# Patient Record
Sex: Male | Born: 1965 | Race: White | Hispanic: No | Marital: Married | Smoking: Never smoker
Health system: Southern US, Community
[De-identification: ages and names within clinical notes are randomized; demographics above are authoritative.]

## PROBLEM LIST (undated history)

## (undated) DIAGNOSIS — T7840XA Allergy, unspecified, initial encounter: Secondary | ICD-10-CM

## (undated) DIAGNOSIS — K219 Gastro-esophageal reflux disease without esophagitis: Secondary | ICD-10-CM

## (undated) DIAGNOSIS — E785 Hyperlipidemia, unspecified: Secondary | ICD-10-CM

## (undated) DIAGNOSIS — M109 Gout, unspecified: Secondary | ICD-10-CM

## (undated) DIAGNOSIS — I10 Essential (primary) hypertension: Secondary | ICD-10-CM

## (undated) DIAGNOSIS — K649 Unspecified hemorrhoids: Secondary | ICD-10-CM

## (undated) DIAGNOSIS — J302 Other seasonal allergic rhinitis: Secondary | ICD-10-CM

## (undated) HISTORY — DX: Allergy, unspecified, initial encounter: T78.40XA

## (undated) HISTORY — DX: Essential (primary) hypertension: I10

## (undated) HISTORY — PX: FRACTURE SURGERY: SHX138

## (undated) HISTORY — PX: CLAVICLE SURGERY: SHX598

## (undated) HISTORY — DX: Gout, unspecified: M10.9

## (undated) HISTORY — PX: EXCISIONAL HEMORRHOIDECTOMY: SHX1541

## (undated) HISTORY — DX: Other seasonal allergic rhinitis: J30.2

## (undated) HISTORY — PX: WISDOM TOOTH EXTRACTION: SHX21

## (undated) HISTORY — DX: Unspecified hemorrhoids: K64.9

## (undated) HISTORY — DX: Hyperlipidemia, unspecified: E78.5

## (undated) HISTORY — DX: Gastro-esophageal reflux disease without esophagitis: K21.9

---

## 1985-11-23 HISTORY — PX: APPENDECTOMY: SHX54

## 1996-11-23 HISTORY — PX: HAND SURGERY: SHX662

## 1998-12-03 ENCOUNTER — Encounter: Payer: Self-pay | Admitting: Gastroenterology

## 1998-12-03 ENCOUNTER — Ambulatory Visit (HOSPITAL_COMMUNITY): Admission: RE | Admit: 1998-12-03 | Discharge: 1998-12-03 | Payer: Self-pay | Admitting: Gastroenterology

## 2004-11-23 HISTORY — PX: CLAVICLE SURGERY: SHX598

## 2006-10-19 ENCOUNTER — Ambulatory Visit: Payer: Self-pay | Admitting: Internal Medicine

## 2006-10-19 LAB — CONVERTED CEMR LAB
ALT: 34 units/L (ref 0–40)
AST: 29 units/L (ref 0–37)
Albumin: 4.7 g/dL (ref 3.5–5.2)
Alkaline Phosphatase: 79 units/L (ref 39–117)
Bilirubin, Direct: 0.2 mg/dL (ref 0.0–0.3)
Chol/HDL Ratio, serum: 6.2
Cholesterol: 223 mg/dL (ref 0–200)
HDL: 36.2 mg/dL — ABNORMAL LOW (ref >39.0)
LDL DIRECT: 171.4 mg/dL
Total Bilirubin: 1.2 mg/dL (ref 0.3–1.2)
Total Protein: 7.2 g/dL (ref 6.0–8.3)
Triglyceride fasting, serum: 141 mg/dL (ref 0–149)
VLDL: 28 mg/dL (ref 0–40)

## 2006-11-28 ENCOUNTER — Emergency Department (HOSPITAL_COMMUNITY): Admission: EM | Admit: 2006-11-28 | Discharge: 2006-11-28 | Payer: Self-pay | Admitting: Family Medicine

## 2007-02-11 ENCOUNTER — Emergency Department (HOSPITAL_COMMUNITY): Admission: EM | Admit: 2007-02-11 | Discharge: 2007-02-11 | Payer: Self-pay | Admitting: Family Medicine

## 2007-07-27 ENCOUNTER — Ambulatory Visit: Payer: Self-pay | Admitting: Internal Medicine

## 2007-07-27 DIAGNOSIS — E785 Hyperlipidemia, unspecified: Secondary | ICD-10-CM | POA: Insufficient documentation

## 2007-07-28 LAB — CONVERTED CEMR LAB
ALT: 44 units/L (ref 0–53)
AST: 29 units/L (ref 0–37)
Albumin: 4.4 g/dL (ref 3.5–5.2)
Alkaline Phosphatase: 81 units/L (ref 39–117)
Bilirubin, Direct: 0.2 mg/dL (ref 0.0–0.3)
Cholesterol: 263 mg/dL (ref 0–200)
Direct LDL: 123.2 mg/dL
HDL: 35 mg/dL — ABNORMAL LOW (ref >39.0)
Total Bilirubin: 1.2 mg/dL (ref 0.3–1.2)
Total CHOL/HDL Ratio: 7.5
Total Protein: 7.3 g/dL (ref 6.0–8.3)
Triglycerides: 526 mg/dL (ref 0–149)
VLDL: 105 mg/dL — ABNORMAL HIGH (ref 0–40)

## 2007-08-04 ENCOUNTER — Telehealth: Payer: Self-pay | Admitting: Internal Medicine

## 2007-08-25 ENCOUNTER — Telehealth (INDEPENDENT_AMBULATORY_CARE_PROVIDER_SITE_OTHER): Payer: Self-pay | Admitting: *Deleted

## 2008-05-10 ENCOUNTER — Emergency Department (HOSPITAL_COMMUNITY): Admission: EM | Admit: 2008-05-10 | Discharge: 2008-05-10 | Payer: Self-pay | Admitting: Family Medicine

## 2008-07-17 ENCOUNTER — Emergency Department (HOSPITAL_COMMUNITY): Admission: EM | Admit: 2008-07-17 | Discharge: 2008-07-17 | Payer: Self-pay | Admitting: Family Medicine

## 2009-01-25 ENCOUNTER — Ambulatory Visit: Payer: Self-pay | Admitting: Internal Medicine

## 2009-01-25 DIAGNOSIS — J309 Allergic rhinitis, unspecified: Secondary | ICD-10-CM

## 2009-03-12 ENCOUNTER — Telehealth: Payer: Self-pay | Admitting: Internal Medicine

## 2009-05-18 ENCOUNTER — Telehealth: Payer: Self-pay | Admitting: Family Medicine

## 2009-11-11 ENCOUNTER — Telehealth: Payer: Self-pay | Admitting: Internal Medicine

## 2009-11-13 ENCOUNTER — Telehealth: Payer: Self-pay | Admitting: Internal Medicine

## 2009-11-18 ENCOUNTER — Ambulatory Visit: Payer: Self-pay | Admitting: Internal Medicine

## 2009-11-18 LAB — CONVERTED CEMR LAB
Cholesterol: 201 mg/dL — ABNORMAL HIGH (ref 0–200)
Direct LDL: 109.4 mg/dL
HDL: 39.8 mg/dL (ref >39.00)
Total CHOL/HDL Ratio: 5
Triglycerides: 328 mg/dL — ABNORMAL HIGH (ref 0.0–149.0)
VLDL: 65.6 mg/dL — ABNORMAL HIGH (ref 0.0–40.0)

## 2009-11-28 ENCOUNTER — Ambulatory Visit: Payer: Self-pay | Admitting: Internal Medicine

## 2010-02-11 ENCOUNTER — Telehealth: Payer: Self-pay | Admitting: Internal Medicine

## 2010-02-11 ENCOUNTER — Encounter: Payer: Self-pay | Admitting: Internal Medicine

## 2010-02-13 ENCOUNTER — Ambulatory Visit: Payer: Self-pay | Admitting: Internal Medicine

## 2010-02-13 LAB — CONVERTED CEMR LAB
ALT: 41 units/L (ref 0–53)
AST: 26 units/L (ref 0–37)
Albumin: 4.4 g/dL (ref 3.5–5.2)
Alkaline Phosphatase: 70 units/L (ref 39–117)
BUN: 13 mg/dL (ref 6–23)
Basophils Absolute: 0 10*3/uL (ref 0.0–0.1)
Basophils Relative: 0.8 % (ref 0.0–3.0)
Bilirubin Urine: NEGATIVE
Bilirubin, Direct: 0.1 mg/dL (ref 0.0–0.3)
Blood in Urine, dipstick: NEGATIVE
CO2: 27 meq/L (ref 19–32)
Calcium: 9.3 mg/dL (ref 8.4–10.5)
Chloride: 104 meq/L (ref 96–112)
Cholesterol: 228 mg/dL — ABNORMAL HIGH (ref 0–200)
Creatinine, Ser: 0.8 mg/dL (ref 0.4–1.5)
Direct LDL: 131.1 mg/dL
Eosinophils Absolute: 0.2 10*3/uL (ref 0.0–0.7)
Eosinophils Relative: 3.7 % (ref 0.0–5.0)
GFR calc non Af Amer: 111.77 mL/min (ref >60)
Glucose, Bld: 94 mg/dL (ref 70–99)
Glucose, Urine, Semiquant: NEGATIVE
HCT: 45.5 % (ref 39.0–52.0)
HDL: 43.4 mg/dL (ref >39.00)
Hemoglobin: 15.4 g/dL (ref 13.0–17.0)
Ketones, urine, test strip: NEGATIVE
Lymphocytes Relative: 31.2 % (ref 12.0–46.0)
Lymphs Abs: 1.5 10*3/uL (ref 0.7–4.0)
MCHC: 34 g/dL (ref 30.0–36.0)
MCV: 93.2 fL (ref 78.0–100.0)
Monocytes Absolute: 0.4 10*3/uL (ref 0.1–1.0)
Monocytes Relative: 8.7 % (ref 3.0–12.0)
Neutro Abs: 2.7 10*3/uL (ref 1.4–7.7)
Neutrophils Relative %: 55.6 % (ref 43.0–77.0)
Nitrite: NEGATIVE
Platelets: 195 10*3/uL (ref 150.0–400.0)
Potassium: 3.7 meq/L (ref 3.5–5.1)
Protein, U semiquant: NEGATIVE
RBC: 4.88 M/uL (ref 4.22–5.81)
RDW: 11.5 % (ref 11.5–14.6)
Sodium: 141 meq/L (ref 135–145)
Specific Gravity, Urine: 1.02
TSH: 2.23 microintl units/mL (ref 0.35–5.50)
Total Bilirubin: 0.7 mg/dL (ref 0.3–1.2)
Total CHOL/HDL Ratio: 5
Total Protein: 7.4 g/dL (ref 6.0–8.3)
Triglycerides: 310 mg/dL — ABNORMAL HIGH (ref 0.0–149.0)
Urobilinogen, UA: 0.2
VLDL: 62 mg/dL — ABNORMAL HIGH (ref 0.0–40.0)
WBC Urine, dipstick: NEGATIVE
WBC: 4.8 10*3/uL (ref 4.5–10.5)

## 2010-02-18 ENCOUNTER — Ambulatory Visit: Payer: Self-pay | Admitting: Internal Medicine

## 2010-04-02 IMAGING — CT CT PARANASAL SINUSES LIMITED
1 of 2 series · 16 of 25 positions shown, 20 images · non-contrast
Comparison: None

CLINICAL DATA: Acute sinusitis.

CT PARANASAL SINUS LIMITED WITHOUT CONTRAST

[Series 4: ltd sinus 3.0 h30s · axial · 0.29mm/px · z∈[-99,-1]mm · 16 of 24 slices shown, 20 images]
[im 2/24  brain]
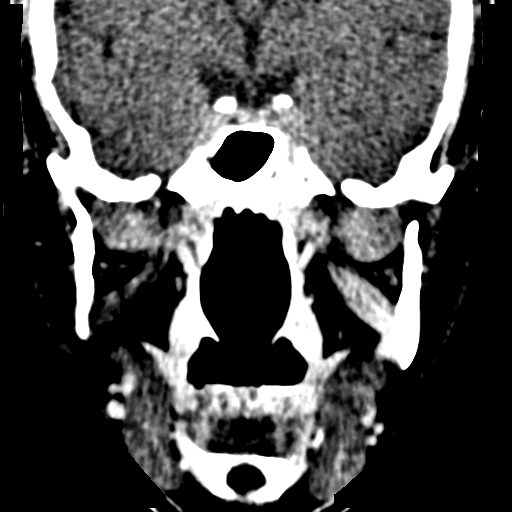
[im 2/24  bone]
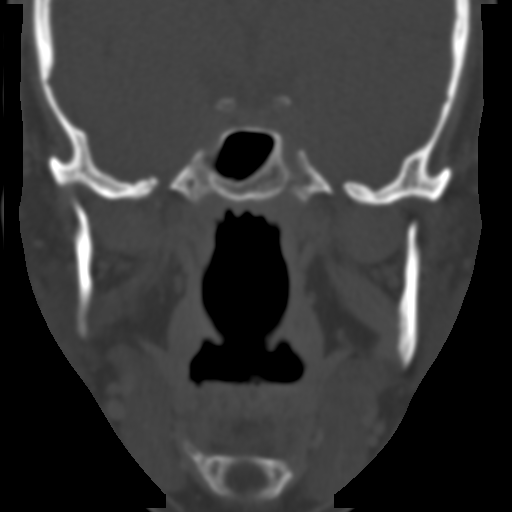
[im 4/24  bone]
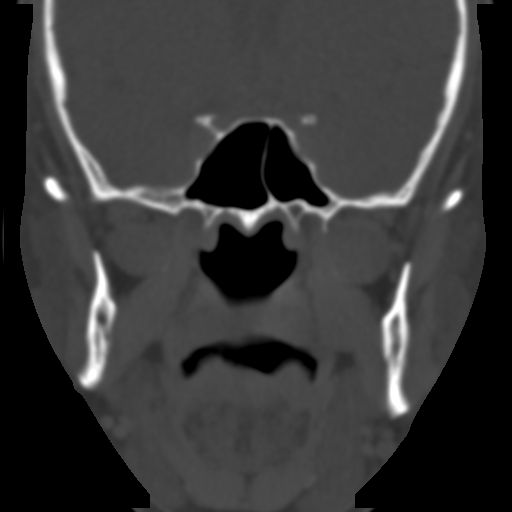
[im 5/24  bone]
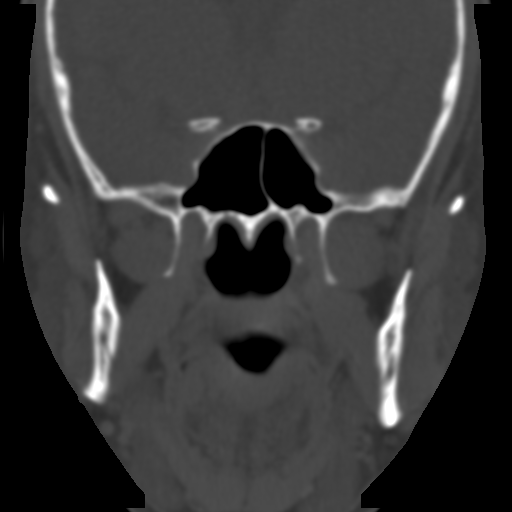
[im 6/24  bone]
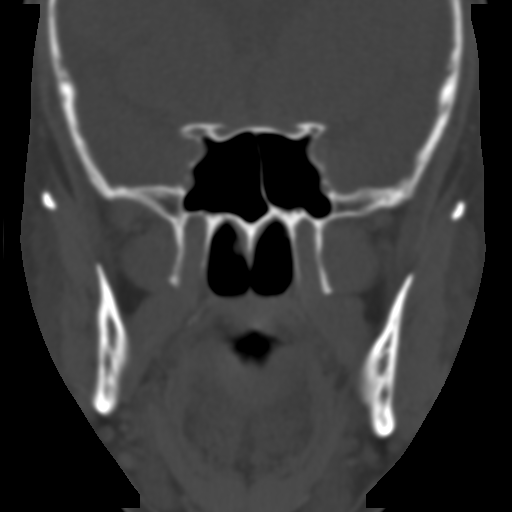
[im 8/24  brain]
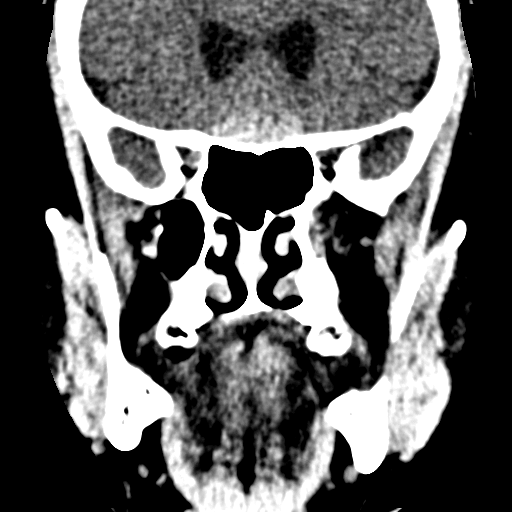
[im 8/24  bone]
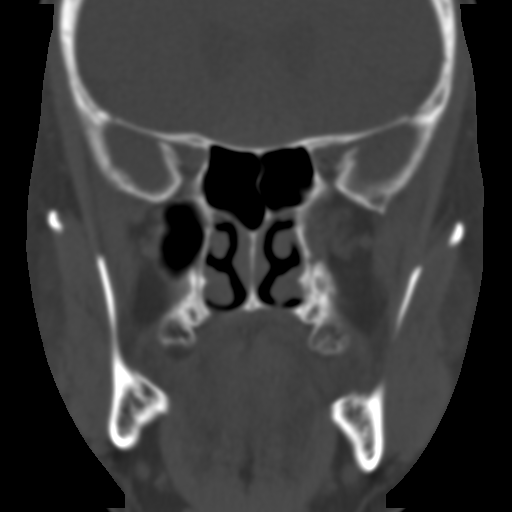
[im 9/24  bone]
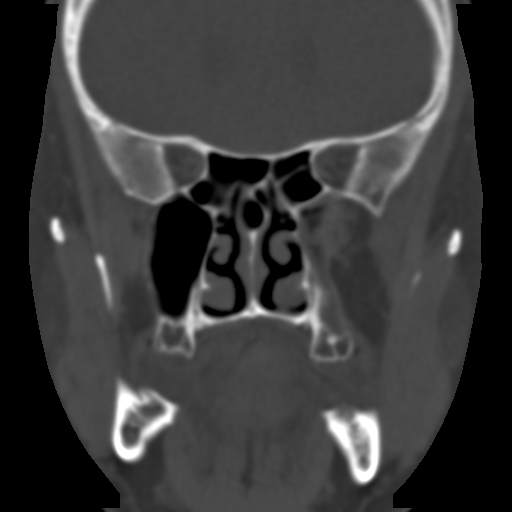
[im 10/24  bone]
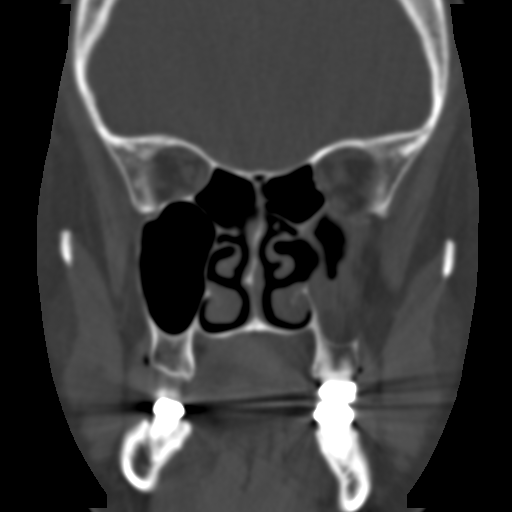
[im 12/24  bone]
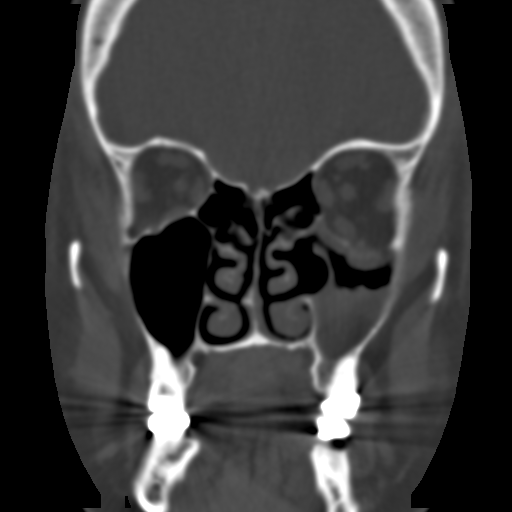
[im 13/24  brain]
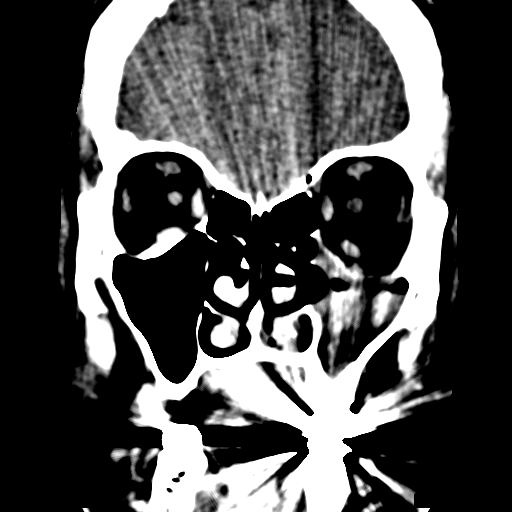
[im 13/24  bone]
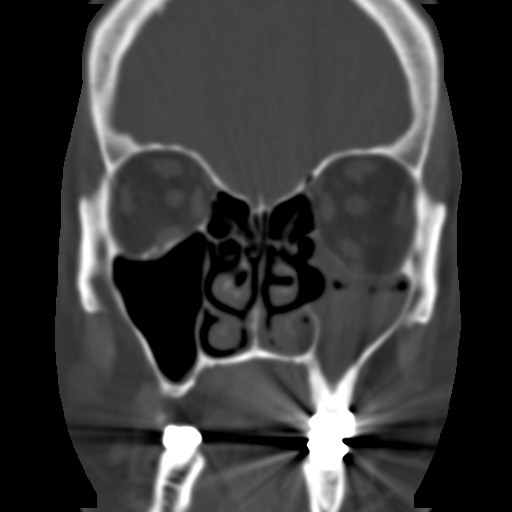
[im 15/24  bone]
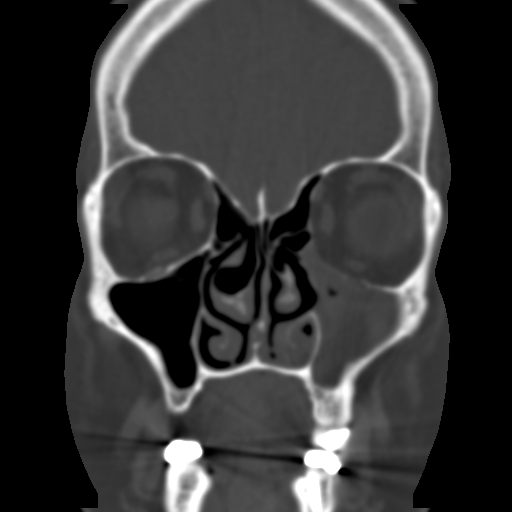
[im 16/24  bone]
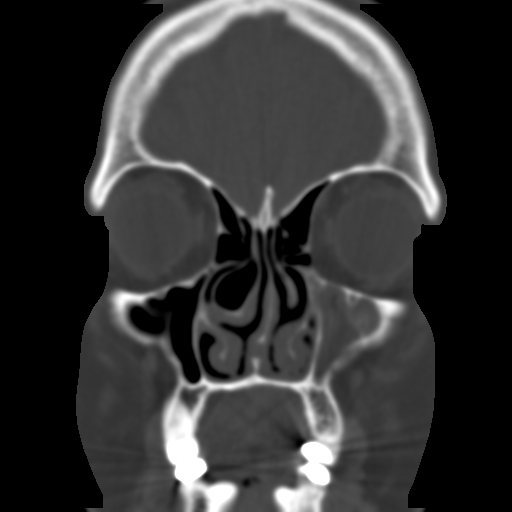
[im 17/24  bone]
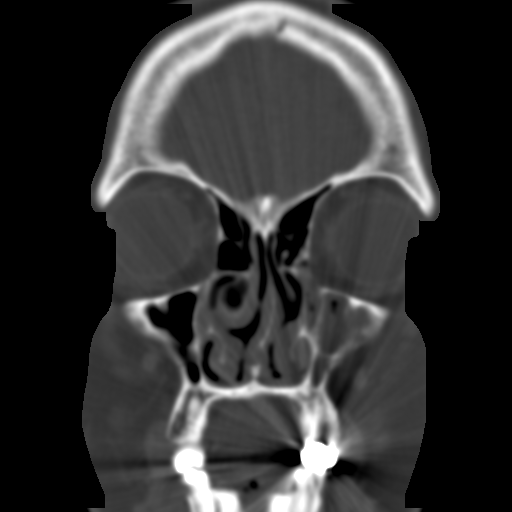
[im 19/24  brain]
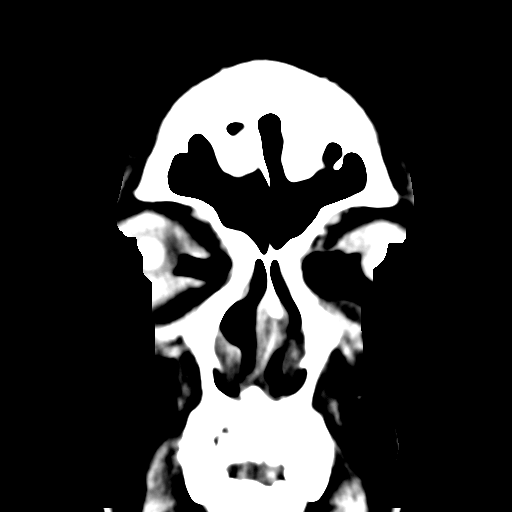
[im 19/24  bone]
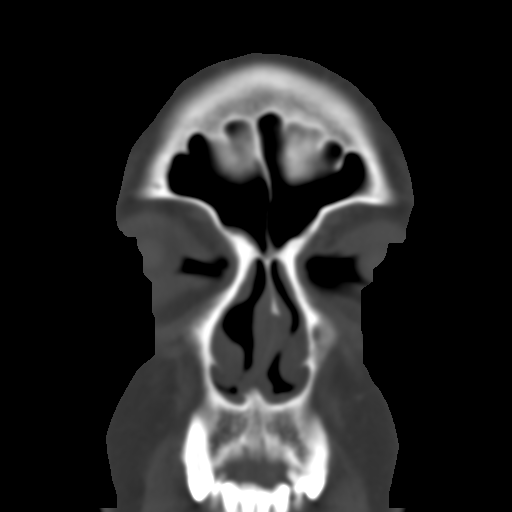
[im 20/24  bone]
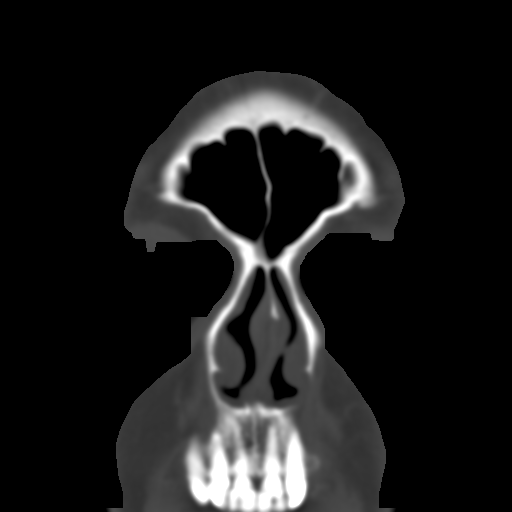
[im 21/24  bone]
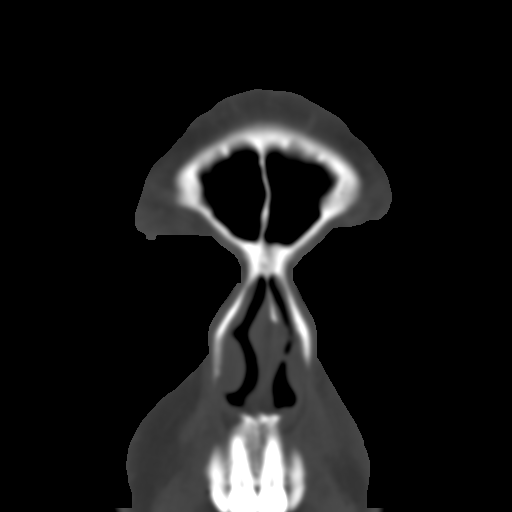
[im 23/24  bone]
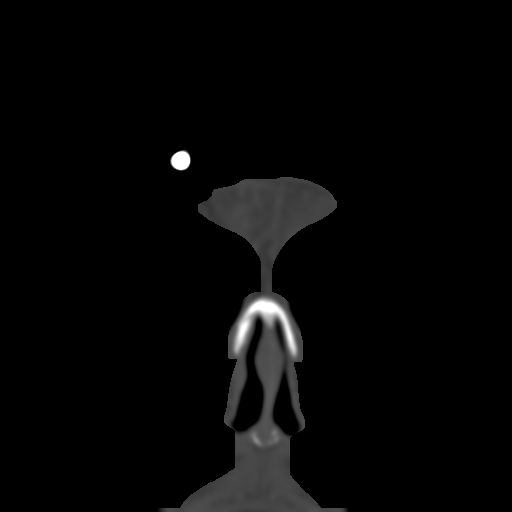

[16 of 25 positions shown; findings below may reference images not displayed]

FINDINGS: There is an air-fluid level in the left maxillary sinus
with frothy secretions.  This is compatible with acute sinusitis.
There is mild mucosal thickening in the base of the right maxillary
sinus.  There is mild mucosal thickening in the frontal sinuses.

The nasal septum is deviated to the left, partially due to a large
concha bullosa of the right middle turbinate.  No acute bony
abnormality.  There is no soft tissue mass.
IMPRESSION: Acute maxillary sinusitis on the left.

## 2010-09-25 ENCOUNTER — Telehealth: Payer: Self-pay | Admitting: Internal Medicine

## 2010-12-14 ENCOUNTER — Encounter: Payer: Self-pay | Admitting: Otolaryngology

## 2010-12-23 NOTE — Assessment & Plan Note (Signed)
Summary: new to lipid/hyperlipidemia   Primary Provider:  Birdie Sons MD   History of Present Illness:       This is a 45 year old male who presents for his first evaluation in [facility] Clinic.  The patient presents with history of hyperlipidemia, but has no history of smoking or obesity.  Pertinent family history includes hyperlipidemia/CAD - father died of MI had multiple - may have been 50ish when had first.  The patient is compliant with medication-fair.    Hypertension History:      Positive major cardiovascular risk factors include hyperlipidemia.  Negative major cardiovascular risk factors include male age less than 2 years old, no history of diabetes, and non-tobacco-user status.        Further assessment for target organ damage reveals no history of renal insufficiency.    Has tried simvastatin, atorvastatin, rouvastatin - all cause muscle pain/ cramping  - in hands moslty - after taking consistantly for a few months. - Pt non- compliant with meds and unsure of time frame with muscle pains   Preventive Screening-Counseling & Management  Alcohol-Tobacco     Alcohol drinks/day: 3-4 on weekends and maybe 1-2 other days /week     Alcohol type: beer     Smoking Status: never  Caffeine-Diet-Exercise     Caffeine use/day: 1     Does Patient Exercise: yes     Type of exercise: weights     Times/week: <3  Current Medications (verified): 1)  Sudafed 30 Mg  Tabs (Pseudoephedrine Hcl) .... Prn 2)  Prednisone 20 Mg Tabs (Prednisone) .... Take 3 By Mouth Once Daily For 2 Days Then 2 By Mouth Once Daily For 3 Days or As Needed 3)  Zithromax Z-Pak 250 Mg Tabs (Azithromycin) .... As Directed 4)  Flonase 50 Mcg/act Susp (Fluticasone Propionate) .... 2 Sprays Each Nostril Q D 5)  Lovaza 1 Gm Caps (Omega-3-Acid Ethyl Esters) .... 2 By Mouth Two Times A Day 6)  Simvastatin 40 Mg Tabs (Simvastatin) .... Take One Tablet By Mouth Daily At Bedtime  Allergies (verified): 1)  ! Pcn 2)  ! *  Peanuts  Social History: Alcohol drinks/day:  3-4 on weekends and maybe 1-2 other days /week Caffeine use/day:  1 Smoking Status:  never Does Patient Exercise:  yes   Vital Signs:  Patient profile:   45 year old male Height:      73 inches Weight:      192 pounds BMI:     25.42 Pulse rate:   71 / minute Pulse rhythm:   regular BP sitting:   130 / 90  (left arm) Cuff size:   large  Impression & Recommendations:  Problem # 1:  HYPERLIPIDEMIA (ICD-272.4)  His updated medication list for this problem includes:    Lovaza 1 Gm Caps (Omega-3-acid ethyl esters) .Marland Kitchen... 2 by mouth two times a day    Simvastatin 40 Mg Tabs (Simvastatin) .Marland Kitchen... Take one tablet by mouth daily at bedtime  This is Johnny Hopkins first visit to Lipid Clinic.  His wife is a Teacher, early years/pre at [employer].  He a history of increased lipids especially increased TG and positive family history for MI.  He has  no history of CAD or DM.  He is non-compliant with meds and heart healthy diet.  He will take his mds for a few weeks then quit as well as eat healthy for a couple weeks then increase junk food.    TC  201 > goal <  200    LDL 109 > goal < 100  HDL 40  at goal > 40   TG 161  > goal < 150  Plan  - add cardio exercise 2 days a week - walking  / also lifting weights 2-3 days a week - decrease soda, sugar snacks and CHO with snacks and meals - increase water- - be more contientious about med compliance  - add CoQ 10 to help prevent muscle cramps - f/u 3 months  Hypertension Assessment/Plan:      The patient's hypertensive risk group is category B: At least one risk factor (excluding diabetes) with no target organ damage.  Today's blood pressure is 130/90.    Prescriptions: SIMVASTATIN 40 MG TABS (SIMVASTATIN) Take one tablet by mouth daily at bedtime  #30 x 3   Entered by:   Leota Sauers, PharmD, BCPS, CPP   Authorized by:   Gaylord Shih, MD, [facility]   Signed by:   Leota Sauers, PharmD, BCPS, CPP on 11/28/2009   Method  used:   Electronically to        [facility]* (retail)       [address]       [address] Shipping/mailing       [place], [place]  [address]       Ph: [phone]       Fax: [phone]   RxID:   [id number] LOVAZA 1 GM CAPS (OMEGA-3-ACID ETHYL ESTERS) 2 by mouth two times a day  #360 x 3   Entered by:   Leota Sauers, PharmD, BCPS, CPP   Authorized by:   Gaylord Shih, MD, [facility]   Signed by:   Leota Sauers, PharmD, BCPS, CPP on 11/28/2009   Method used:   Electronically to        [facility]* (retail)       [address]       [address] Shipping/mailing       [place], [place]  [address]       Ph: [phone]       Fax: [phone]   RxID:   [id number]

## 2010-12-23 NOTE — Miscellaneous (Signed)
Summary: Orders Update  Clinical Lists Changes  Orders: Added new Test order of TLB-Lipid Panel (80061-LIPID) - Signed Added new Test order of TLB-Hepatic/Liver Function Pnl (80076-HEPATIC) - Signed 

## 2010-12-23 NOTE — Assessment & Plan Note (Signed)
Summary: CPX//CCM   Vital Signs:  Patient profile:   45 year old male Height:      72.5 inches Weight:      190 pounds BMI:     25.51 Pulse rate:   76 / minute Pulse rhythm:   regular Resp:     12 per minute BP sitting:   116 / 74  (left arm)  Vitals Entered By: Gladis Riffle, RN (February 18, 2010 10:14 AM) CC: cpx, labs done--requests metaxalone for prn use for back flare Is Patient Diabetic? No   Primary Care Provider:  Birdie Sons MD  CC:  cpx and labs done--requests metaxalone for prn use for back flare.  History of Present Illness: CPX  Preventive Screening-Counseling & Management  Alcohol-Tobacco     Alcohol drinks/day: 3-4 on weekends and maybe 1-2 other days /week     Alcohol type: beer     Smoking Status: never  Current Problems (verified): 1)  Preventive Health Care  (ICD-V70.0) 2)  Allergic Rhinitis  (ICD-477.9) 3)  Hyperlipidemia  (ICD-272.4) 4)  Prostatitis,  (ICD-601.0)  Current Medications (verified): 1)  Flonase 50 Mcg/act Susp (Fluticasone Propionate) .... 2 Sprays Each Nostril Q D 2)  Lovaza 1 Gm Caps (Omega-3-Acid Ethyl Esters) .... 2 By Mouth Two Times A Day 3)  Simvastatin 40 Mg Tabs (Simvastatin) .... Take One Tablet By Mouth Daily At Bedtime  Allergies: 1)  ! Pcn 2)  ! * Peanuts  Family History: Mother--alive and well Father deceased CHF/MI 48s  Social History: Married-monogomous pet poodle  hh of 4   Occupation: exhibit (trade show display)   Impression & Recommendations:  Problem # 1:  PREVENTIVE HEALTH CARE (ICD-V70.0) health maint UTD encouraged daily exercise  Problem # 2:  HYPERLIPIDEMIA (ICD-272.4)  he is convinced that simvastatin is causing some aches and pains---has tried other statins with similar sxs (lipitor , crestor) trial of holding meds then trial of livalo 1mg  every other day , continue lovaza for now see me 6 weeks after starting lo The following medications were removed from the medication list:  Simvastatin 40 Mg Tabs (Simvastatin) .Marland Kitchen... Take one tablet by mouth daily at bedtime His updated medication list for this problem includes:    Lovaza 1 Gm Caps (Omega-3-acid ethyl esters) .Marland Kitchen... 2 by mouth two times a day    Livalo 2 Mg Tabs (Pitavastatin calcium) .Marland Kitchen... 1/2 every other day start in one month (april 27/2011)  Labs Reviewed: SGOT: 26 (02/13/2010)   SGPT: 41 (02/13/2010)  Prior 10 Yr Risk Heart Disease: Not enough information (11/28/2009)   HDL:43.40 (02/13/2010), 39.80 (11/18/2009)  LDL:DEL (07/27/2007), DEL (10/19/2006)  Chol:228 (02/13/2010), 201 (11/18/2009)  Trig:310.0 (02/13/2010), 328.0 (11/18/2009)  Complete Medication List: 1)  Flonase 50 Mcg/act Susp (Fluticasone propionate) .... 2 sprays each nostril q d 2)  Lovaza 1 Gm Caps (Omega-3-acid ethyl esters) .... 2 by mouth two times a day 3)  Livalo 2 Mg Tabs (Pitavastatin calcium) .... 1/2 every other day start in one month (april 27/2011) 4)  Methocarbamol 750 Mg Tabs (Methocarbamol) .... Take 1 tablet by mouth two times a day as needed back pain  Other Orders: Tdap => 74yrs IM ([address]403) Admin 1st Vaccine (47425)  Patient Instructions: 1)  see me 3 months 2)  lipids 272.4 3)  liver 995.2  Prescriptions: METHOCARBAMOL 750 MG  TABS (METHOCARBAMOL) Take 1 tablet by mouth two times a day as needed back pain  #20 x 1   Entered and Authorized by:   Smitty Cords  Delsa Walder MD   Signed by:   Birdie Sons MD on 02/18/2010   Method used:   Electronically to        [facility]* (retail)       [address]       [address] Shipping/mailing       [place], [place]  [address]       Ph: [phone]       Fax: [phone]   RxID:   [id number]  Physical Exam General Appearance: well developed, well nourished, no acute distress Eyes: conjunctiva and lids normal, PERRLA, EOMI,  Ears, Nose, Mouth, Throat: TM clear, nares clear, oral exam WNL Neck: supple, no lymphadenopathy, no thyromegaly, no JVD Respiratory:  clear to auscultation and percussion, respiratory effort normal Cardiovascular: regular rate and rhythm, S1-S2, no murmur, rub or gallop, no bruits, peripheral pulses normal and symmetric, no cyanosis, clubbing, edema or varicosities Chest: no scars, masses, tenderness; no asymmetry, skin changes,, no gynecomastia   Gastrointestinal: soft, non-tender; no hepatosplenomegaly, masses; active bowel sounds all quadrants, guaiac negative stool; no masses, tenderness, hemorrhoids  Genitourinary: no hernia, or prostate enlargement Lymphatic: no cervical, axillary or inguinal adenopathy Musculoskeletal: gait normal, muscle tone and strength WNL, no joint swelling, effusions, discoloration, crepitus  Skin: clear, good turgor, color WNL, no rashes, lesions, or ulcerations Neurologic: normal mental status, normal reflexes, normal strength, sensation, and motion Psychiatric: alert; oriented to person, place and time Other Exam:      Immunizations Administered:  Tetanus Vaccine:    Vaccine Type: Tdap    Site: left deltoid    Mfr: GlaxoSmithKline    Dose: 0.5 ml    Route: IM    Given by: Gladis Riffle, RN    Exp. Date: 09/18/2010    Lot #: [id number]

## 2010-12-23 NOTE — Progress Notes (Signed)
Summary: REQ FOR ADDITIONAL LAB ORDER  Phone Note From Other Clinic   Caller: Jamie w/ [facility] Summary of Call: Asher Muir w/ [facility] called to adv that there may be an addition to the labs that are being ordered for pt to have at time of appt on Thursday, 02/13/2010 at [facility].... We will be awaiting order for addtional labs from Physician. Initial call taken by: Debbra Riding,  February 11, 2010 3:02 PM  Follow-up for Phone Call        Phone Note Completed-----Orders have been signed off by Dr Cato Mulligan... See additional notation in EMR for same.  Follow-up by: Debbra Riding,  February 11, 2010 3:04 PM

## 2010-12-23 NOTE — Progress Notes (Signed)
Summary: REFILL REQUEST  Phone Note Refill Request Message from:  Patient on September 25, 2010 12:10 PM  Refills Requested: Medication #1:  FLONASE 50 MCG/ACT SUSP 2 sprays each nostril q d   Notes: 90-DAY SUPPLY.... [facility].  Pt req that Rx be filled today - adv he is going out of town and would like to p/u Rx today if possible.    Initial call taken by: Debbra Riding,  September 25, 2010 12:10 PM  Follow-up for Phone Call        Rx called to pharmacy Follow-up by: Alfred Levins, CMA,  September 25, 2010 1:02 PM    Prescriptions: FLONASE 50 MCG/ACT SUSP (FLUTICASONE PROPIONATE) 2 sprays each nostril q d  #3 x 1   Entered by:   Alfred Levins, CMA   Authorized by:   Birdie Sons MD   Signed by:   Alfred Levins, CMA on 09/25/2010   Method used:   Electronically to        [facility]* (retail)       [address]       [address] Shipping/mailing       [place], [place]  [address]       Ph: [phone]       Fax: [phone]   RxID:   [id number]

## 2011-02-16 ENCOUNTER — Other Ambulatory Visit: Payer: Self-pay | Admitting: Cardiology

## 2011-02-19 NOTE — Telephone Encounter (Signed)
[address] °

## 2011-02-20 NOTE — Telephone Encounter (Signed)
Pt sees lipid clinic

## 2011-08-20 LAB — POCT RAPID STREP A: Streptococcus, Group A Screen (Direct): NEGATIVE

## 2011-10-26 ENCOUNTER — Other Ambulatory Visit: Payer: Self-pay | Admitting: Internal Medicine

## 2011-10-26 ENCOUNTER — Other Ambulatory Visit (HOSPITAL_COMMUNITY): Payer: Self-pay | Admitting: Sports Medicine

## 2011-10-26 DIAGNOSIS — M25562 Pain in left knee: Secondary | ICD-10-CM

## 2011-10-27 ENCOUNTER — Other Ambulatory Visit (HOSPITAL_COMMUNITY): Payer: Self-pay

## 2011-10-29 ENCOUNTER — Other Ambulatory Visit (HOSPITAL_COMMUNITY): Payer: Self-pay

## 2012-04-04 ENCOUNTER — Telehealth: Payer: Self-pay | Admitting: Internal Medicine

## 2012-04-04 DIAGNOSIS — E785 Hyperlipidemia, unspecified: Secondary | ICD-10-CM

## 2012-04-04 NOTE — Telephone Encounter (Signed)
Per Dr Cato Mulligan needs OV, 1 wk prior can have cpx labs and uric acid

## 2012-04-04 NOTE — Telephone Encounter (Signed)
Called and lft vm for pt to sch a cpx with labs 1 wk prior. Waiting on call back.

## 2012-04-04 NOTE — Telephone Encounter (Signed)
Pt can put pressure on it.  It has been bothering him for 2 weeks and puts ice on it.  Told pt to go to [facility] or go to [facility] so they could get an xray.  Pt does not want to do that tonight.  He states that feels better.  He does not remember injuring it.  Pt said if it got bad he would do whatever Dr Cato Mulligan wanted him to do but he doesn't feel like it needs to be xrayed cause he feels its not broke

## 2012-04-04 NOTE — Telephone Encounter (Signed)
Pt called back and has schd cpx for 05/03/12 and cpx labs w/ uric acid tested on 04/05/12 as noted. Pt said that his ankle is hurting really bad. At times it hurts so bad that he can't walk on it. Redness and swelling for 2 wks.

## 2012-04-04 NOTE — Telephone Encounter (Signed)
Pt called req to get labs ordered to check cholesterol lvls. Also pt is req to be tested for gout because pt says that he has been having problems with ankle. Pain, redness and swelling.

## 2012-04-05 ENCOUNTER — Other Ambulatory Visit (INDEPENDENT_AMBULATORY_CARE_PROVIDER_SITE_OTHER): Payer: 59

## 2012-04-05 DIAGNOSIS — Z0000 Encounter for general adult medical examination without abnormal findings: Secondary | ICD-10-CM

## 2012-04-05 LAB — HEPATIC FUNCTION PANEL
Bilirubin, Direct: 0 mg/dL (ref 0.0–0.3)
Total Bilirubin: 0.5 mg/dL (ref 0.3–1.2)

## 2012-04-05 LAB — LIPID PANEL
HDL: 40.2 mg/dL (ref >39.00)
Triglycerides: 235 mg/dL — ABNORMAL HIGH (ref 0.0–149.0)

## 2012-04-05 LAB — CBC WITH DIFFERENTIAL/PLATELET
Eosinophils Absolute: 0.2 10*3/uL (ref 0.0–0.7)
Eosinophils Relative: 2.4 % (ref 0.0–5.0)
MCHC: 34.3 g/dL (ref 30.0–36.0)
MCV: 92.9 fl (ref 78.0–100.0)
Monocytes Absolute: 0.6 10*3/uL (ref 0.1–1.0)
Neutrophils Relative %: 62.2 % (ref 43.0–77.0)
Platelets: 273 10*3/uL (ref 150.0–400.0)
WBC: 6.4 10*3/uL (ref 4.5–10.5)

## 2012-04-05 LAB — BASIC METABOLIC PANEL
BUN: 14 mg/dL (ref 6–23)
Chloride: 106 mEq/L (ref 96–112)
Creatinine, Ser: 0.8 mg/dL (ref 0.4–1.5)

## 2012-04-05 LAB — POCT URINALYSIS DIPSTICK
Glucose, UA: NEGATIVE
Leukocytes, UA: NEGATIVE
Nitrite, UA: NEGATIVE

## 2012-04-05 LAB — URIC ACID: Uric Acid, Serum: 8.1 mg/dL — ABNORMAL HIGH (ref 4.0–7.8)

## 2012-04-05 MED ORDER — FLUTICASONE PROPIONATE 50 MCG/ACT NA SUSP: 2.0000 | Freq: Every day | NASAL | Status: DC

## 2012-04-05 MED ORDER — SIMVASTATIN 40 MG PO TABS: 40.0000 mg | ORAL_TABLET | Freq: Every day | ORAL | Status: DC

## 2012-04-05 NOTE — Telephone Encounter (Signed)
Ov with any physician 

## 2012-04-05 NOTE — Telephone Encounter (Signed)
Pt is going to wait and see how his uric acid level turns out.  He is feeling better as of right now but will call back if he needs to

## 2012-04-08 ENCOUNTER — Telehealth: Payer: Self-pay | Admitting: Internal Medicine

## 2012-04-08 DIAGNOSIS — E785 Hyperlipidemia, unspecified: Secondary | ICD-10-CM

## 2012-04-08 MED ORDER — FLUTICASONE PROPIONATE 50 MCG/ACT NA SUSP: 2.0000 | Freq: Every day | NASAL | Status: DC

## 2012-04-08 MED ORDER — SIMVASTATIN 40 MG PO TABS: 40.0000 mg | ORAL_TABLET | Freq: Every day | ORAL | Status: DC

## 2012-04-08 NOTE — Telephone Encounter (Signed)
Pt needs blood work results. Pt needs refill on chole med,flonase sent to [facility].

## 2012-04-08 NOTE — Telephone Encounter (Signed)
Refill sent.  Patient requests lab results

## 2012-04-10 NOTE — Telephone Encounter (Signed)
Labs ok  except cholesterol too high and blood sugar slighty elevated Ok to refill his med for 60 days

## 2012-04-11 NOTE — Telephone Encounter (Signed)
Done

## 2012-04-19 ENCOUNTER — Telehealth: Payer: Self-pay | Admitting: Internal Medicine

## 2012-04-19 NOTE — Telephone Encounter (Signed)
Pt states he has an ankle problem which he believes it has gout in it.  He is  Requesting an rx for endocet and allipuronol for daily uric acid control.  Pt states he still has not received his lab results after three phone calls to the office. Pt is going out of town and would like to have medication before leaving.

## 2012-04-19 NOTE — Telephone Encounter (Signed)
Cindy aware.

## 2012-04-19 NOTE — Telephone Encounter (Signed)
Pt aware of lab results uric acid elevated and he is gout flare up, would like a rx called in

## 2012-04-20 ENCOUNTER — Ambulatory Visit (INDEPENDENT_AMBULATORY_CARE_PROVIDER_SITE_OTHER): Payer: 59 | Admitting: Internal Medicine

## 2012-04-20 ENCOUNTER — Encounter: Payer: Self-pay | Admitting: Internal Medicine

## 2012-04-20 VITALS — BP 140/100 | Temp 97.9°F | Wt 192.0 lb

## 2012-04-20 DIAGNOSIS — M79609 Pain in unspecified limb: Secondary | ICD-10-CM

## 2012-04-20 DIAGNOSIS — R7989 Other specified abnormal findings of blood chemistry: Secondary | ICD-10-CM

## 2012-04-20 DIAGNOSIS — E790 Hyperuricemia without signs of inflammatory arthritis and tophaceous disease: Secondary | ICD-10-CM

## 2012-04-20 DIAGNOSIS — M79673 Pain in unspecified foot: Secondary | ICD-10-CM

## 2012-04-20 MED ORDER — INDOMETHACIN 50 MG PO CAPS: 50.0000 mg | ORAL_CAPSULE | Freq: Three times a day (TID) | ORAL | Status: AC

## 2012-04-20 MED ORDER — ALLOPURINOL 100 MG PO TABS: 100.0000 mg | ORAL_TABLET | Freq: Every day | ORAL | Status: DC

## 2012-04-20 NOTE — Progress Notes (Signed)
  Subjective:    Patient ID: Johnny Hopkins, male    DOB: 01/06/1966, 46 y.o.   MRN: [id number]  HPI  46 year old patient who is seen today with a chief complaint of pain and swelling involving the left foot. He states that he has 3 or 4 episodes of foot pain that occurs bilaterally per year this has increased in frequency and severity more recently. He has had recent lab for his annual exam and noted to have mild hyperuricemia. He is requesting both Indocin and allopurinol for suspected gouty arthritis. His wife is a Teacher, early years/pre    Review of Systems  Musculoskeletal: Positive for joint swelling and arthralgias.       Objective:   Physical Exam  Musculoskeletal:       Mild soft tissue swelling about the dorsal aspect of the left foot          Assessment & Plan:   Left foot pain Hyperuricemia Possible recurrent gouty arthritis  Options were discussed;  the patient is aware that a definitive diagnosis of gout cannot be established but is suspected. A low purine  diet was recommended;   He was given a prescription for a modest dose of allopurinol per his request and also indomethacin. We'll reassess at the time of his annual physical

## 2012-04-20 NOTE — Telephone Encounter (Signed)
Left message on voicemail for pt to call and schedule ov

## 2012-04-20 NOTE — Telephone Encounter (Signed)
Per Dr Cato Mulligan, pt needs to be seen.  Schedule an OV

## 2012-04-20 NOTE — Patient Instructions (Addendum)
Purine Restricted Diet  A low-purine diet consists of foods that reduce uric acid made in your body.  INDICATIONS FOR USE    Your caregiver may ask you to follow a low-purine diet to reduce gout flairs.    GUIDELINES    Avoid high-purine foods, including all alcohol, yeast extracts taken as supplements, and sauces made from meats (like gravy). Do not eat high-purine meats, including anchovies, sardines, herring, mussels, tuna, codfish, scallops, trout, haddock, bacon, organ meats, tripe, goose, wild game, and sweetbreads.    Grains   Allowed/Recommended: All, except those listed to consume in moderation.    Consume in Moderation: Oatmeal (? cup uncooked daily), wheat bran or germ ( cup daily), and whole grains.   Vegetables   Allowed/Recommended: All, except those listed to consume in moderation.    Consume in Moderation: Asparagus, cauliflower, spinach, mushrooms, and green peas ( cup daily).   Fruit   Allowed/Recommended: All.    Consume in Moderation: None.   Meat and Meat Substitutes   Allowed/Recommended: Eggs, nuts, and peanut butter.    Consume in Moderation: Limit to 4 to 6 oz daily. Avoid high-purine meats. Lentils, peas, and dried beans (1 cup daily).   Milk   Allowed/Recommended: All. Choose low-fat or skim when possible.    Consume in Moderation: None.   Fats and Oils   Allowed/Recommended: All.    Consume in Moderation: None.   Beverages   Allowed/Recommended: All, except those listed to avoid.    Avoid: All alcohol.   Condiments/Miscellaneous   Allowed/Recommended: All, except those listed to consume in moderation.    Consume in Moderation: Bouillon and meat-based broths and soups.   Document Released: 03/06/2011 Document Revised: 10/29/2011 Document Reviewed: 03/06/2011  ExitCare Patient Information 2012 [employer].    Gout   Gout is an inflammatory condition (arthritis) caused by a buildup of uric acid crystals in the joints. Uric acid is a chemical that is normally present in the blood. Under some circumstances, uric acid can form into crystals in your joints. This causes joint redness, soreness, and swelling (inflammation). Repeat attacks are common. Over time, uric acid crystals can form into masses (tophi) near a joint, causing disfigurement. Gout is treatable and often preventable.  CAUSES    The disease begins with elevated levels of uric acid in the blood. Uric acid is produced by your body when it breaks down a naturally found substance called purines. This also happens when you eat certain foods such as meats and fish. Causes of an elevated uric acid level include:   Being passed down from parent to child (heredity).    Diseases that cause increased uric acid production (obesity, psoriasis, some cancers).    Excessive alcohol use.    Diet, especially diets rich in meat and seafood.    Medicines, including certain cancer-fighting drugs (chemotherapy), diuretics, and aspirin.    Chronic kidney disease. The kidneys are no longer able to remove uric acid well.    Problems with metabolism.   Conditions strongly associated with gout include:   Obesity.    High blood pressure.    High cholesterol.    Diabetes.   Not everyone with elevated uric acid levels gets gout. It is not understood why some people get gout and others do not. Surgery, joint injury, and eating too much of certain foods are some of the factors that can lead to gout.  SYMPTOMS     An attack of gout comes on quickly. It causes   intense pain with redness, swelling, and warmth in a joint.    Fever can occur.    Often, only one joint is involved. Certain joints are more commonly involved:    Base of the big toe.    Knee.    Ankle.    Wrist.    Finger.    Without treatment, an attack usually goes away in a few days to weeks. Between attacks, you usually will not have symptoms, which is different from many other forms of arthritis.  DIAGNOSIS    Your caregiver will suspect gout based on your symptoms and exam. Removal of fluid from the joint (arthrocentesis) is done to check for uric acid crystals. Your caregiver will give you a medicine that numbs the area (local anesthetic) and use a needle to remove joint fluid for exam. Gout is confirmed when uric acid crystals are seen in joint fluid, using a special microscope. Sometimes, blood, urine, and X-ray tests are also used.  TREATMENT    There are 2 phases to gout treatment: treating the sudden onset (acute) attack and preventing attacks (prophylaxis).  Treatment of an Acute Attack   Medicines are used. These include anti-inflammatory medicines or steroid medicines.    An injection of steroid medicine into the affected joint is sometimes necessary.    The painful joint is rested. Movement can worsen the arthritis.    You may use warm or cold treatments on painful joints, depending which works best for you.    Discuss the use of coffee, vitamin C, or cherries with your caregiver. These may be helpful treatment options.   Treatment to Prevent Attacks  After the acute attack subsides, your caregiver may advise prophylactic medicine. These medicines either help your kidneys eliminate uric acid from your body or decrease your uric acid production. You may need to stay on these medicines for a very long time.  The early phase of treatment with prophylactic medicine can be associated with an increase in acute gout attacks. For this reason, during the first few months of treatment, your caregiver may also advise you to take medicines usually used for acute gout treatment. Be sure you understand your caregiver's directions.   You should also discuss dietary treatment with your caregiver. Certain foods such as meats and fish can increase uric acid levels. Other foods such as dairy can decrease levels. Your caregiver can give you a list of foods to avoid.  HOME CARE INSTRUCTIONS     Do not take aspirin to relieve pain. This raises uric acid levels.    Only take over-the-counter or prescription medicines for pain, discomfort, or fever as directed by your caregiver.    Rest the joint as much as possible. When in bed, keep sheets and blankets off painful areas.    Keep the affected joint raised (elevated).    Use crutches if the painful joint is in your leg.    Drink enough water and fluids to keep your urine clear or pale yellow. This helps your body get rid of uric acid. Do not drink alcoholic beverages. They slow the passage of uric acid.    Follow your caregiver's dietary instructions. Pay careful attention to the amount of protein you eat. Your daily diet should emphasize fruits, vegetables, whole grains, and fat-free or low-fat milk products.    Maintain a healthy body weight.   SEEK MEDICAL CARE IF:     You have an oral temperature above 102 F (38.9 C).    You   develop diarrhea, vomiting, or any side effects from medicines.    You do not feel better in 24 hours, or you are getting worse.   SEEK IMMEDIATE MEDICAL CARE IF:     Your joint becomes suddenly more tender and you have:    Chills.    An oral temperature above 102 F (38.9 C), not controlled by medicine.   MAKE SURE YOU:     Understand these instructions.    Will watch your condition.    Will get help right away if you are not doing well or get worse.   Document Released: 11/06/2000 Document Revised: 10/29/2011 Document Reviewed: 02/17/2010  [employer] Patient Information 2012 [employer].

## 2012-05-03 ENCOUNTER — Encounter: Payer: Self-pay | Admitting: Internal Medicine

## 2012-07-26 ENCOUNTER — Other Ambulatory Visit: Payer: Self-pay | Admitting: Internal Medicine

## 2012-07-28 ENCOUNTER — Other Ambulatory Visit: Payer: Self-pay | Admitting: Internal Medicine

## 2012-07-28 NOTE — Telephone Encounter (Signed)
Please advise- he has only been seen once( by you ) in the last 4 yrs

## 2012-07-28 NOTE — Telephone Encounter (Signed)
Seen by Dr Kirtland Bouchard and prescribed by Dr Kirtland Bouchard.  Pt has not seen Dr Cato Mulligan since 2009.

## 2012-07-28 NOTE — Telephone Encounter (Signed)
Dr. Swords pt 

## 2012-07-29 NOTE — Telephone Encounter (Signed)
ok 

## 2012-11-11 ENCOUNTER — Other Ambulatory Visit: Payer: Self-pay | Admitting: Internal Medicine

## 2013-03-16 ENCOUNTER — Other Ambulatory Visit: Payer: Self-pay | Admitting: Internal Medicine

## 2013-03-28 ENCOUNTER — Telehealth: Payer: Self-pay | Admitting: Internal Medicine

## 2013-03-28 DIAGNOSIS — E785 Hyperlipidemia, unspecified: Secondary | ICD-10-CM

## 2013-03-28 NOTE — Telephone Encounter (Signed)
You haven't seen this since 2011 but he has an appt with you in August for a CPX.  Is it ok to refill till then?

## 2013-03-28 NOTE — Telephone Encounter (Signed)
Pt called to request a refill of his fluticasone (FLONASE) 50 MCG/ACT nasal spray, simvastatin (ZOCOR) 40 MG tablet, allopurinol (ZYLOPRIM) 100 MG tablet, and pt also describes Methocarbynol as a muscle relaxer that you have given him in the past for back pain. He states that he was pressure washing over the weekend, and realized that he didn't have any. Please assist.

## 2013-03-29 MED ORDER — ALLOPURINOL 100 MG PO TABS: 100.0000 mg | ORAL_TABLET | Freq: Every day | ORAL | Status: DC

## 2013-03-29 MED ORDER — FLUTICASONE PROPIONATE 50 MCG/ACT NA SUSP: NASAL | Status: DC

## 2013-03-29 MED ORDER — SIMVASTATIN 40 MG PO TABS: 40.0000 mg | ORAL_TABLET | Freq: Every day | ORAL | Status: DC

## 2013-03-29 NOTE — Telephone Encounter (Signed)
Refill meds till CPX in August.  Call pt and reschedule labs

## 2013-03-29 NOTE — Telephone Encounter (Signed)
Have him come in for CPX labs now. Okay to keep appointment in August. Okay to refill Zocor and allopurinol.

## 2013-04-04 ENCOUNTER — Ambulatory Visit (INDEPENDENT_AMBULATORY_CARE_PROVIDER_SITE_OTHER): Payer: 59 | Admitting: Family Medicine

## 2013-04-04 ENCOUNTER — Encounter: Payer: Self-pay | Admitting: Family Medicine

## 2013-04-04 ENCOUNTER — Other Ambulatory Visit (INDEPENDENT_AMBULATORY_CARE_PROVIDER_SITE_OTHER): Payer: 59

## 2013-04-04 ENCOUNTER — Ambulatory Visit: Payer: 59 | Admitting: Family Medicine

## 2013-04-04 VITALS — BP 148/90 | Temp 98.7°F

## 2013-04-04 DIAGNOSIS — Z0000 Encounter for general adult medical examination without abnormal findings: Secondary | ICD-10-CM

## 2013-04-04 DIAGNOSIS — M545 Low back pain: Secondary | ICD-10-CM

## 2013-04-04 LAB — CBC WITH DIFFERENTIAL/PLATELET
Basophils Absolute: 0.1 10*3/uL (ref 0.0–0.1)
Hemoglobin: 16.5 g/dL (ref 13.0–17.0)
Lymphocytes Relative: 30 % (ref 12.0–46.0)
Monocytes Relative: 9.9 % (ref 3.0–12.0)
Neutro Abs: 3.1 10*3/uL (ref 1.4–7.7)
Neutrophils Relative %: 53.7 % (ref 43.0–77.0)
RBC: 5.16 Mil/uL (ref 4.22–5.81)
RDW: 12 % (ref 11.5–14.6)

## 2013-04-04 LAB — POCT URINALYSIS DIPSTICK
Bilirubin, UA: NEGATIVE
Glucose, UA: NEGATIVE
Ketones, UA: NEGATIVE
Leukocytes, UA: NEGATIVE
Protein, UA: NEGATIVE
Spec Grav, UA: 1.025

## 2013-04-04 LAB — BASIC METABOLIC PANEL
CO2: 24 mEq/L (ref 19–32)
Chloride: 107 mEq/L (ref 96–112)
Creatinine, Ser: 0.9 mg/dL (ref 0.4–1.5)
Potassium: 3.8 mEq/L (ref 3.5–5.1)

## 2013-04-04 LAB — HEPATIC FUNCTION PANEL
AST: 31 U/L (ref 0–37)
Alkaline Phosphatase: 80 U/L (ref 39–117)
Bilirubin, Direct: 0.1 mg/dL (ref 0.0–0.3)
Total Bilirubin: 0.6 mg/dL (ref 0.3–1.2)

## 2013-04-04 LAB — LIPID PANEL
Total CHOL/HDL Ratio: 7
VLDL: 99.8 mg/dL — ABNORMAL HIGH (ref 0.0–40.0)

## 2013-04-04 LAB — TSH: TSH: 3.11 u[IU]/mL (ref 0.35–5.50)

## 2013-04-04 MED ORDER — METHOCARBAMOL 500 MG PO TABS: 500.0000 mg | ORAL_TABLET | Freq: Four times a day (QID) | ORAL | Status: DC | PRN

## 2013-04-04 NOTE — Patient Instructions (Addendum)

## 2013-04-04 NOTE — Progress Notes (Signed)
  Subjective:    Patient ID: Johnny Hopkins, male    DOB: 1966/03/05, 47 y.o.   MRN: [id number]  HPI Patient seen with low back pain. Also about 2 weeks ago after doing some pressure washing. Pain is somewhat bilateral. Muscle tension off and on. No radiculopathy symptoms. Severity is fairly mild. Use ibuprofen with some relief. Previously used Robaxin which helped No numbness or weakness.  No past medical history on file. No past surgical history on file.  reports that he has never smoked. He has never used smokeless tobacco. He reports that  drinks alcohol. He reports that he does not use illicit drugs. family history is not on file. Allergies  Allergen Reactions  . Peanut-Containing Drug Products     REACTION: anaphylaxis  . Penicillins     REACTION: hives      Review of Systems  Constitutional: Negative for fever, activity change, appetite change and unexpected weight change.  Respiratory: Negative for cough and shortness of breath.   Cardiovascular: Negative for chest pain and leg swelling.  Gastrointestinal: Negative for vomiting and abdominal pain.  Genitourinary: Negative for dysuria, hematuria and flank pain.  Musculoskeletal: Positive for back pain. Negative for joint swelling.  Neurological: Negative for weakness and numbness.       Objective:   Physical Exam  Constitutional: He appears well-developed and well-nourished. No distress.  Cardiovascular: Normal rate and regular rhythm.   Pulmonary/Chest: Effort normal and breath sounds normal. No respiratory distress. He has no wheezes. He has no rales.  Musculoskeletal: He exhibits no edema.  Straight leg raises are negative Minimal soft tissue muscular tenderness lower lumbar region bilaterally  Neurological:  Strength is full lower extremities. Symmetric lower extremity reflexes          Assessment & Plan:  Lumbar back strain. Suspect muscular. Continue heat and stretches. Robaxin 500 mg every 6 hours as  needed.  Continue ibuprofen as needed

## 2013-05-22 ENCOUNTER — Encounter: Payer: 59 | Admitting: Internal Medicine

## 2013-05-22 DIAGNOSIS — Z0289 Encounter for other administrative examinations: Secondary | ICD-10-CM

## 2013-06-19 ENCOUNTER — Other Ambulatory Visit: Payer: 59

## 2013-06-26 ENCOUNTER — Encounter: Payer: 59 | Admitting: Internal Medicine

## 2013-08-04 ENCOUNTER — Telehealth: Payer: Self-pay | Admitting: Internal Medicine

## 2013-08-04 NOTE — Telephone Encounter (Signed)
Pt didn't elaborate on why he wants to switch.

## 2013-08-04 NOTE — Telephone Encounter (Signed)
Pt would like to switch to Dr Caryl Never. Can I sch?

## 2013-08-07 NOTE — Telephone Encounter (Signed)
Ok with me 

## 2013-08-07 NOTE — Telephone Encounter (Signed)
ok 

## 2013-08-08 NOTE — Telephone Encounter (Signed)
Pt has been sch

## 2013-08-31 ENCOUNTER — Ambulatory Visit (INDEPENDENT_AMBULATORY_CARE_PROVIDER_SITE_OTHER): Payer: 59 | Admitting: Family Medicine

## 2013-08-31 ENCOUNTER — Encounter: Payer: Self-pay | Admitting: Family Medicine

## 2013-08-31 VITALS — BP 140/90 | HR 112 | Temp 97.7°F | Wt 194.0 lb

## 2013-08-31 DIAGNOSIS — E785 Hyperlipidemia, unspecified: Secondary | ICD-10-CM

## 2013-08-31 DIAGNOSIS — Z0000 Encounter for general adult medical examination without abnormal findings: Secondary | ICD-10-CM

## 2013-08-31 DIAGNOSIS — Z23 Encounter for immunization: Secondary | ICD-10-CM

## 2013-08-31 MED ORDER — ALLOPURINOL 100 MG PO TABS: ORAL_TABLET | ORAL | Status: DC

## 2013-08-31 MED ORDER — ROSUVASTATIN CALCIUM 20 MG PO TABS: 20.0000 mg | ORAL_TABLET | Freq: Every day | ORAL | Status: DC

## 2013-08-31 MED ORDER — METHOCARBAMOL 500 MG PO TABS: 500.0000 mg | ORAL_TABLET | Freq: Four times a day (QID) | ORAL | Status: DC | PRN

## 2013-08-31 NOTE — Progress Notes (Signed)
Subjective:    Patient ID: Johnny Hopkins, male    DOB: [birth year], 47 y.o.   MRN: [id number]  HPI Patient seen for complete physical This chronic problems include history of hyperlipidemia and, gout, and seasonal allergies. Currently takes simvastatin but takes it irregularly because some pain in his feet and occasional myalgias. He takes allopurinol 100 mg daily. Labs last May with uric acid 6.9. Lipids were not well controlled with total cholesterol 238 with triglycerides 499 and LDL 122.  Positive family history of CAD in his father had several MIs. Patient is nonsmoker. No consistent exercise.  Last tetanus 2011. No family history of prostate or colon cancer. Does not drink alcohol daily but sometimes up to 4-5 beers per day  Past Medical History  Diagnosis Date  . Hyperlipidemia   . Gout   . Allergy    No past surgical history on file.  reports that he has never smoked. He has never used smokeless tobacco. He reports that he drinks alcohol. He reports that he does not use illicit drugs. family history includes Heart disease (age of onset: 28) in his father. Allergies  Allergen Reactions  . Peanut-Containing Drug Products     REACTION: anaphylaxis  . Penicillins     REACTION: hives      Review of Systems  Constitutional: Negative for fever, activity change, appetite change and fatigue.  HENT: Negative for congestion, ear pain and trouble swallowing.   Eyes: Negative for pain and visual disturbance.  Respiratory: Negative for cough, shortness of breath and wheezing.   Cardiovascular: Negative for chest pain and palpitations.  Gastrointestinal: Negative for nausea, vomiting, abdominal pain, diarrhea, constipation, blood in stool, abdominal distention and rectal pain.  Genitourinary: Negative for dysuria, hematuria and testicular pain.  Musculoskeletal: Positive for arthralgias and myalgias. Negative for joint swelling.  Skin: Negative for rash.  Neurological: Negative for  dizziness, syncope and headaches.  Hematological: Negative for adenopathy.  Psychiatric/Behavioral: Negative for confusion and dysphoric mood.       Objective:   Physical Exam  Constitutional: He is oriented to person, place, and time. He appears well-developed and well-nourished. No distress.  HENT:  Head: Normocephalic and atraumatic.  Right Ear: External ear normal.  Left Ear: External ear normal.  Mouth/Throat: Oropharynx is clear and moist.  Eyes: Conjunctivae and EOM are normal. Pupils are equal, round, and reactive to light.  Neck: Normal range of motion. Neck supple. No thyromegaly present.  Cardiovascular: Normal rate, regular rhythm and normal heart sounds.   No murmur heard. Pulmonary/Chest: No respiratory distress. He has no wheezes. He has no rales.  Abdominal: Soft. Bowel sounds are normal. He exhibits no distension and no mass. There is no tenderness. There is no rebound and no guarding.  Genitourinary: Rectum normal and prostate normal.  Musculoskeletal: He exhibits no edema.  Lymphadenopathy:    He has no cervical adenopathy.  Neurological: He is alert and oriented to person, place, and time. He displays normal reflexes. No cranial nerve deficit.  Skin: No rash noted.  Psychiatric: He has a normal mood and affect.          Assessment & Plan:  Complete physical. Tetanus up-to-date. Flu vaccine given. Change simvastatin to Crestor 20 mg daily hopefully to circumvent myalgias. Recheck lipid and hepatic in 2 months. Titrate allopurinol 200 mg daily with a goal uric acid less than 6.  We discussed the importance of moderation in alcohol with no more than 24 ounces of beer per 24 hours  and relevance of alcohol intake to hypertriglyceridemia

## 2013-08-31 NOTE — Patient Instructions (Signed)
Stop Simvastatin Start Crestor 20 mg once daily Increase Allopurinol to 200 mg daily. Alcohol in moderation- no more than 2 daily.

## 2013-08-31 NOTE — Addendum Note (Signed)
Addended by: Shelby Dubin E on: 08/31/2013 10:51 AM   Modules accepted: Orders

## 2013-10-31 ENCOUNTER — Other Ambulatory Visit (INDEPENDENT_AMBULATORY_CARE_PROVIDER_SITE_OTHER): Payer: 59

## 2013-10-31 DIAGNOSIS — E785 Hyperlipidemia, unspecified: Secondary | ICD-10-CM

## 2013-10-31 LAB — HEPATIC FUNCTION PANEL
Albumin: 4.8 g/dL (ref 3.5–5.2)
Total Bilirubin: 0.9 mg/dL (ref 0.3–1.2)

## 2013-10-31 LAB — LDL CHOLESTEROL, DIRECT: Direct LDL: 113.7 mg/dL

## 2013-10-31 LAB — LIPID PANEL
Cholesterol: 210 mg/dL — ABNORMAL HIGH (ref 0–200)
HDL: 38.7 mg/dL — ABNORMAL LOW (ref >39.00)
Total CHOL/HDL Ratio: 5
Triglycerides: 460 mg/dL — ABNORMAL HIGH (ref 0.0–149.0)
VLDL: 92 mg/dL — ABNORMAL HIGH (ref 0.0–40.0)

## 2013-11-13 ENCOUNTER — Telehealth: Payer: Self-pay | Admitting: Internal Medicine

## 2013-11-13 NOTE — Telephone Encounter (Signed)
Pt wife who is pharmacist at [facility] has the flu. Pt is requesting tami-flu he has stuffy nose. [facility] out pt pharm. Pt stated burchette is his doctor

## 2013-11-13 NOTE — Telephone Encounter (Signed)
If she has CONFIRMED flu, can start Tamifle 75 mg po bid for 5 days.  Influenza with no fever would be very unusual.  If she has not had confirmed flu, I would not recommend Tamiflu based on the stated symptoms.

## 2013-11-13 NOTE — Telephone Encounter (Signed)
Pt stated that he is having a sore throat, achy just a little, has not checked his temp doesn't think he has a fever, and a runny nose. Just started feeling the symptoms just a day ago.

## 2013-11-14 NOTE — Telephone Encounter (Signed)
Pt stated that he is feeling fine and that he read up on the medication tamiflu and he did not want to talk the medication.

## 2013-11-14 NOTE — Telephone Encounter (Signed)
Left message for patient to return call.

## 2013-12-11 ENCOUNTER — Other Ambulatory Visit: Payer: Self-pay | Admitting: Internal Medicine

## 2014-08-24 ENCOUNTER — Telehealth: Payer: Self-pay | Admitting: Internal Medicine

## 2014-08-24 ENCOUNTER — Encounter: Payer: Self-pay | Admitting: Family Medicine

## 2014-08-24 ENCOUNTER — Ambulatory Visit (INDEPENDENT_AMBULATORY_CARE_PROVIDER_SITE_OTHER): Payer: 59 | Admitting: Family Medicine

## 2014-08-24 VITALS — BP 162/107 | HR 90 | Temp 98.4°F | Ht 72.5 in | Wt 202.0 lb

## 2014-08-24 DIAGNOSIS — R079 Chest pain, unspecified: Secondary | ICD-10-CM

## 2014-08-24 DIAGNOSIS — M1000 Idiopathic gout, unspecified site: Secondary | ICD-10-CM

## 2014-08-24 DIAGNOSIS — M940 Chondrocostal junction syndrome [Tietze]: Secondary | ICD-10-CM

## 2014-08-24 DIAGNOSIS — I10 Essential (primary) hypertension: Secondary | ICD-10-CM | POA: Insufficient documentation

## 2014-08-24 DIAGNOSIS — M109 Gout, unspecified: Secondary | ICD-10-CM | POA: Insufficient documentation

## 2014-08-24 MED ORDER — ALLOPURINOL 100 MG PO TABS: ORAL_TABLET | ORAL | Status: DC

## 2014-08-24 MED ORDER — MELOXICAM 15 MG PO TABS: 15.0000 mg | ORAL_TABLET | Freq: Every day | ORAL | Status: DC

## 2014-08-24 MED ORDER — INDOMETHACIN 50 MG PO CAPS: 50.0000 mg | ORAL_CAPSULE | Freq: Three times a day (TID) | ORAL | Status: DC

## 2014-08-24 MED ORDER — LISINOPRIL 10 MG PO TABS: 10.0000 mg | ORAL_TABLET | Freq: Every day | ORAL | Status: DC

## 2014-08-24 NOTE — Progress Notes (Signed)
Pre visit review using our clinic review tool, if applicable. No additional management support is needed unless otherwise documented below in the visit note. 

## 2014-08-24 NOTE — Telephone Encounter (Signed)
Patient Information:  Caller Name: Dontavis  Phone: [phone]  Patient: Johnny, Hopkins  Gender: Male  DOB: May 27, 1966  Age: 48 Years  PCP: Phoebe Sharps (Adults only, leaving end of July 2015)  Office Follow Up:  Does the office need to follow up with this patient?: No  Instructions For The Office: N/A   Symptoms  Reason For Call & Symptoms: Pt is calling and states that he is having some chest pain that started in June or July 2015; breast bone feels llike it is popping; pain comes on with movement; denies any injury; did come on after moving some furniture;  taking Aleve and does get relief;  requesting to be seen in the office;  pain comes and goes  Reviewed Health History In EMR: Yes  Reviewed Medications In EMR: Yes  Reviewed Allergies In EMR: Yes  Reviewed Surgeries / Procedures: Yes  Date of Onset of Symptoms: 04/23/2014  Treatments Tried: Aleve  Treatments Tried Worked: Yes  Guideline(s) Used:  Chest Pain  Disposition Per Guideline:   See Today in Office  Reason For Disposition Reached:   Intermittent chest pains persist > 3 days  Advice Given:  Call Back If:  Severe chest pain  You become worse.  Patient Will Follow Care Advice:  YES  Appointment Scheduled:  08/24/2014 13:30:00 Appointment Scheduled Provider:  Alysia Penna [facility])

## 2014-08-24 NOTE — Telephone Encounter (Signed)
Pt would like to switch to dr Burnice Logan. Can I sch?

## 2014-08-24 NOTE — Telephone Encounter (Signed)
Dr fry has agreed to see pt

## 2014-08-24 NOTE — Progress Notes (Signed)
   Subjective:    Patient ID: Johnny Hopkins, male    DOB: May 23, 1966, 48 y.o.   MRN: [id number]  HPI Here with intermittent chest pains over the past 2 months. These are sharp in nature and are centered over the sternum. They may last a few seconds to a few minutes at a time. They never inhibit his activities. No SOB or sweats. He can make them worse by bending over or twisting his trunk. He works out with [employer] frequently. Also I see from his chart that his BP has been creeping up for the past year, and he has a strong family hx of HTN and heart disease.    Review of Systems  Constitutional: Negative.   Respiratory: Negative.   Cardiovascular: Positive for chest pain. Negative for palpitations and leg swelling.  Neurological: Negative.        Objective:   Physical Exam  Constitutional: He is oriented to person, place, and time. He appears well-developed and well-nourished.  Cardiovascular: Normal rate, regular rhythm, normal heart sounds and intact distal pulses.   Pulmonary/Chest: Effort normal and breath sounds normal. No respiratory distress. He has no wheezes. He has no rales. He exhibits tenderness.  Tender along the lower left sternal margin . EKG is normal   Neurological: He is alert and oriented to person, place, and time.          Assessment & Plan:  He has costochondritis and we will start him on Meloxicam 15 mg daily for the next month or two. Advised him to stop lifting weights for awhile until this resolves. Start on Lisinopril 10 mg daily and recheck in one month

## 2014-08-27 ENCOUNTER — Telehealth: Payer: Self-pay | Admitting: Internal Medicine

## 2014-08-27 NOTE — Telephone Encounter (Signed)
emmi emailed °

## 2014-08-29 ENCOUNTER — Telehealth: Payer: Self-pay | Admitting: Internal Medicine

## 2014-08-29 NOTE — Telephone Encounter (Signed)
[facility] - [place], [place] - [address]. Is requesting re-fill on methocarbamol (ROBAXIN) 500 MG tablet  Pt is scheduled to see Dr. Sarajane Jews on 10/12/14

## 2014-08-30 NOTE — Telephone Encounter (Signed)
Call in Robaxin #[id number] with 2 rf

## 2014-08-31 MED ORDER — METHOCARBAMOL 500 MG PO TABS: 500.0000 mg | ORAL_TABLET | Freq: Four times a day (QID) | ORAL | Status: DC | PRN

## 2014-08-31 NOTE — Telephone Encounter (Signed)
I sent script e-scribe. 

## 2014-10-05 ENCOUNTER — Other Ambulatory Visit (INDEPENDENT_AMBULATORY_CARE_PROVIDER_SITE_OTHER): Payer: 59

## 2014-10-05 DIAGNOSIS — Z0000 Encounter for general adult medical examination without abnormal findings: Secondary | ICD-10-CM

## 2014-10-05 DIAGNOSIS — E785 Hyperlipidemia, unspecified: Secondary | ICD-10-CM

## 2014-10-05 LAB — LDL CHOLESTEROL, DIRECT: Direct LDL: 90.7 mg/dL

## 2014-10-05 LAB — CBC WITH DIFFERENTIAL/PLATELET
BASOS ABS: 0 10*3/uL (ref 0.0–0.1)
Basophils Relative: 0.8 % (ref 0.0–3.0)
EOS ABS: 0.2 10*3/uL (ref 0.0–0.7)
Eosinophils Relative: 4.5 % (ref 0.0–5.0)
HEMATOCRIT: 46.2 % (ref 39.0–52.0)
HEMOGLOBIN: 15.8 g/dL (ref 13.0–17.0)
LYMPHS ABS: 1.7 10*3/uL (ref 0.7–4.0)
LYMPHS PCT: 32.2 % (ref 12.0–46.0)
MCHC: 34.2 g/dL (ref 30.0–36.0)
MCV: 91.3 fl (ref 78.0–100.0)
MONO ABS: 0.6 10*3/uL (ref 0.1–1.0)
Monocytes Relative: 10.6 % (ref 3.0–12.0)
NEUTROS ABS: 2.7 10*3/uL (ref 1.4–7.7)
Neutrophils Relative %: 51.9 % (ref 43.0–77.0)
Platelets: 197 10*3/uL (ref 150.0–400.0)
RBC: 5.06 Mil/uL (ref 4.22–5.81)
RDW: 12.4 % (ref 11.5–15.5)
WBC: 5.3 10*3/uL (ref 4.0–10.5)

## 2014-10-05 LAB — LIPID PANEL
Cholesterol: 171 mg/dL (ref 0–200)
HDL: 30.4 mg/dL — ABNORMAL LOW (ref >39.00)
NONHDL: 140.6
Total CHOL/HDL Ratio: 6
Triglycerides: 288 mg/dL — ABNORMAL HIGH (ref 0.0–149.0)
VLDL: 57.6 mg/dL — ABNORMAL HIGH (ref 0.0–40.0)

## 2014-10-05 LAB — BASIC METABOLIC PANEL
BUN: 19 mg/dL (ref 6–23)
CALCIUM: 9.3 mg/dL (ref 8.4–10.5)
CO2: 22 mEq/L (ref 19–32)
Chloride: 106 mEq/L (ref 96–112)
Creatinine, Ser: 0.8 mg/dL (ref 0.4–1.5)
GFR: 117.97 mL/min (ref >60.00)
GLUCOSE: 97 mg/dL (ref 70–99)
POTASSIUM: 3.9 meq/L (ref 3.5–5.1)
Sodium: 138 mEq/L (ref 135–145)

## 2014-10-05 LAB — POCT URINALYSIS DIPSTICK
BILIRUBIN UA: NEGATIVE
Blood, UA: NEGATIVE
GLUCOSE UA: NEGATIVE
KETONES UA: NEGATIVE
LEUKOCYTES UA: NEGATIVE
Nitrite, UA: NEGATIVE
Spec Grav, UA: 1.02
Urobilinogen, UA: 0.2
pH, UA: 6

## 2014-10-05 LAB — HEPATIC FUNCTION PANEL
ALT: 55 U/L — ABNORMAL HIGH (ref 0–53)
AST: 38 U/L — ABNORMAL HIGH (ref 0–37)
Albumin: 4 g/dL (ref 3.5–5.2)
Alkaline Phosphatase: 74 U/L (ref 39–117)
BILIRUBIN DIRECT: 0.2 mg/dL (ref 0.0–0.3)
BILIRUBIN TOTAL: 1.1 mg/dL (ref 0.2–1.2)
Total Protein: 7.1 g/dL (ref 6.0–8.3)

## 2014-10-05 LAB — TSH: TSH: 1.61 u[IU]/mL (ref 0.35–4.50)

## 2014-10-05 LAB — PSA: PSA: 0.21 ng/mL (ref 0.10–4.00)

## 2014-10-12 ENCOUNTER — Encounter: Payer: Self-pay | Admitting: Family Medicine

## 2014-10-12 ENCOUNTER — Ambulatory Visit (INDEPENDENT_AMBULATORY_CARE_PROVIDER_SITE_OTHER): Payer: 59 | Admitting: Family Medicine

## 2014-10-12 VITALS — BP 141/84 | HR 71 | Temp 98.3°F | Ht 72.5 in | Wt 203.0 lb

## 2014-10-12 DIAGNOSIS — Z0000 Encounter for general adult medical examination without abnormal findings: Secondary | ICD-10-CM

## 2014-10-12 MED ORDER — ROSUVASTATIN CALCIUM 20 MG PO TABS: 20.0000 mg | ORAL_TABLET | Freq: Every day | ORAL | Status: DC

## 2014-10-12 MED ORDER — OMEGA-3-ACID ETHYL ESTERS 1 G PO CAPS: 1.0000 g | ORAL_CAPSULE | Freq: Two times a day (BID) | ORAL | Status: DC

## 2014-10-12 NOTE — Progress Notes (Signed)
Pre visit review using our clinic review tool, if applicable. No additional management support is needed unless otherwise documented below in the visit note. 

## 2014-10-12 NOTE — Progress Notes (Signed)
   Subjective:    Patient ID: Johnny Hopkins, male    DOB: [birth year], 48 y.o.   MRN: [id number]  HPI 48 yr old male for a cpx. He feels well. We saw him for costochondritis last month, and this resolved. He was started on Lisinopril and his BP has responded nicely.    Review of Systems  Constitutional: Negative.   HENT: Negative.   Eyes: Negative.   Respiratory: Negative.   Cardiovascular: Negative.   Gastrointestinal: Negative.   Genitourinary: Negative.   Musculoskeletal: Negative.   Skin: Negative.   Neurological: Negative.   Psychiatric/Behavioral: Negative.        Objective:   Physical Exam  Constitutional: He is oriented to person, place, and time. He appears well-developed and well-nourished. No distress.  HENT:  Head: Normocephalic and atraumatic.  Right Ear: External ear normal.  Left Ear: External ear normal.  Nose: Nose normal.  Mouth/Throat: Oropharynx is clear and moist. No oropharyngeal exudate.  Eyes: Conjunctivae and EOM are normal. Pupils are equal, round, and reactive to light. Right eye exhibits no discharge. Left eye exhibits no discharge. No scleral icterus.  Neck: Neck supple. No JVD present. No tracheal deviation present. No thyromegaly present.  Cardiovascular: Normal rate, regular rhythm, normal heart sounds and intact distal pulses.  Exam reveals no gallop and no friction rub.   No murmur heard. Pulmonary/Chest: Effort normal and breath sounds normal. No respiratory distress. He has no wheezes. He has no rales. He exhibits no tenderness.  Abdominal: Soft. Bowel sounds are normal. He exhibits no distension and no mass. There is no tenderness. There is no rebound and no guarding.  Genitourinary: Rectum normal, prostate normal and penis normal. Guaiac negative stool. No penile tenderness.  Musculoskeletal: Normal range of motion. He exhibits no edema or tenderness.  Lymphadenopathy:    He has no cervical adenopathy.  Neurological: He is alert and  oriented to person, place, and time. He has normal reflexes. No cranial nerve deficit. He exhibits normal muscle tone. Coordination normal.  Skin: Skin is warm and dry. No rash noted. He is not diaphoretic. No erythema. No pallor.  Psychiatric: He has a normal mood and affect. His behavior is normal. Judgment and thought content normal.          Assessment & Plan:  Well exam. Add Lovaza to get the TG down. Recheck labs in 6 months

## 2014-11-02 ENCOUNTER — Other Ambulatory Visit: Payer: Self-pay | Admitting: Internal Medicine

## 2014-11-02 NOTE — Telephone Encounter (Signed)
done

## 2015-02-28 ENCOUNTER — Telehealth: Payer: Self-pay | Admitting: Family Medicine

## 2015-02-28 NOTE — Telephone Encounter (Signed)
Pt would like a new rx for an epi pen. Pt is allergic to peanuts and accidentally ate some the other day.  The one he had was expired. Pt got through the episode, but would like to have one one hand. Doesn't think dr fry has ever written for him. Pt has an appt 5/20. [facility] outpt pharm

## 2015-03-01 MED ORDER — EPINEPHRINE 0.3 MG/0.3ML IJ SOAJ: 0.3000 mg | Freq: Once | INTRAMUSCULAR | Status: DC

## 2015-03-01 NOTE — Telephone Encounter (Signed)
Call in an EpiPen double pack  

## 2015-03-01 NOTE — Telephone Encounter (Signed)
I sent script e-scribe. 

## 2015-04-12 ENCOUNTER — Ambulatory Visit (INDEPENDENT_AMBULATORY_CARE_PROVIDER_SITE_OTHER): Payer: 59 | Admitting: Family Medicine

## 2015-04-12 ENCOUNTER — Ambulatory Visit (INDEPENDENT_AMBULATORY_CARE_PROVIDER_SITE_OTHER)
Admission: RE | Admit: 2015-04-12 | Discharge: 2015-04-12 | Disposition: A | Discharge status: Home / Self Care | Payer: 59 | Source: Ambulatory Visit | Attending: Family Medicine | Admitting: Family Medicine

## 2015-04-12 ENCOUNTER — Encounter: Payer: Self-pay | Admitting: Family Medicine

## 2015-04-12 VITALS — BP 121/65 | HR 63 | Temp 97.9°F | Ht 72.5 in | Wt 205.0 lb

## 2015-04-12 DIAGNOSIS — R071 Chest pain on breathing: Secondary | ICD-10-CM

## 2015-04-12 DIAGNOSIS — I10 Essential (primary) hypertension: Secondary | ICD-10-CM | POA: Diagnosis not present

## 2015-04-12 DIAGNOSIS — E785 Hyperlipidemia, unspecified: Secondary | ICD-10-CM | POA: Diagnosis not present

## 2015-04-12 DIAGNOSIS — K219 Gastro-esophageal reflux disease without esophagitis: Secondary | ICD-10-CM

## 2015-04-12 DIAGNOSIS — R0789 Other chest pain: Secondary | ICD-10-CM

## 2015-04-12 LAB — HEPATIC FUNCTION PANEL
ALT: 44 U/L (ref 0–53)
AST: 28 U/L (ref 0–37)
Albumin: 4.4 g/dL (ref 3.5–5.2)
Alkaline Phosphatase: 71 U/L (ref 39–117)
BILIRUBIN DIRECT: 0.1 mg/dL (ref 0.0–0.3)
BILIRUBIN TOTAL: 0.6 mg/dL (ref 0.2–1.2)
Total Protein: 7 g/dL (ref 6.0–8.3)

## 2015-04-12 LAB — LIPID PANEL
CHOL/HDL RATIO: 6
Cholesterol: 201 mg/dL — ABNORMAL HIGH (ref 0–200)
HDL: 36.5 mg/dL — AB (ref >39.00)
NONHDL: 164.5
TRIGLYCERIDES: 310 mg/dL — AB (ref 0.0–149.0)
VLDL: 62 mg/dL — ABNORMAL HIGH (ref 0.0–40.0)

## 2015-04-12 LAB — LDL CHOLESTEROL, DIRECT: Direct LDL: 111 mg/dL

## 2015-04-12 MED ORDER — OMEPRAZOLE 40 MG PO CPDR: 40.0000 mg | DELAYED_RELEASE_CAPSULE | Freq: Every day | ORAL | Status: DC

## 2015-04-12 MED ORDER — METHYLPREDNISOLONE 4 MG PO TBPK: ORAL_TABLET | ORAL | Status: DC

## 2015-04-12 NOTE — Progress Notes (Signed)
Pre visit review using our clinic review tool, if applicable. No additional management support is needed unless otherwise documented below in the visit note. 

## 2015-04-12 NOTE — Progress Notes (Signed)
   Subjective:    Patient ID: Johnny Hopkins, male    DOB: 22-May-1966, 49 y.o.   MRN: [id number]  HPI Here to follow up. His BP is stable and he has been watching his diet. He  continues to deal with intermittent sharp pains at the bottom of the sternum. This is often tender and it pops when he moves certain ways. No SOB. Taking Meloxicam daily but it is not helping.    Review of Systems  Constitutional: Negative.   Respiratory: Negative.   Cardiovascular: Positive for chest pain. Negative for palpitations and leg swelling.       Objective:   Physical Exam  Constitutional: He appears well-developed and well-nourished.  Cardiovascular: Normal rate, regular rhythm, normal heart sounds and intact distal pulses.   Pulmonary/Chest: Effort normal and breath sounds normal. No respiratory distress. He has no wheezes. He has no rales.  Tender around the xiphoid           Assessment & Plan:  He has chronic inflammation around the xiphoid. Get an Xray today. Try a Medrol dose pack. His HTN is stable. Get fasting lipids. Try Omeprazole for the GERD.

## 2015-04-18 ENCOUNTER — Telehealth: Payer: Self-pay | Admitting: Family Medicine

## 2015-04-18 NOTE — Telephone Encounter (Signed)
I spoke with pt  

## 2015-04-18 NOTE — Telephone Encounter (Signed)
Patient states he got a my chart message from you but it was blank.  He would a callback or the message re-sent.

## 2015-05-07 ENCOUNTER — Encounter: Payer: Self-pay | Admitting: Family Medicine

## 2015-05-07 ENCOUNTER — Ambulatory Visit (INDEPENDENT_AMBULATORY_CARE_PROVIDER_SITE_OTHER): Payer: 59 | Admitting: Family Medicine

## 2015-05-07 VITALS — BP 144/86 | HR 85 | Temp 98.2°F | Ht 72.5 in | Wt 200.0 lb

## 2015-05-07 DIAGNOSIS — J018 Other acute sinusitis: Secondary | ICD-10-CM | POA: Diagnosis not present

## 2015-05-07 MED ORDER — AZITHROMYCIN 250 MG PO TABS: ORAL_TABLET | ORAL | Status: DC

## 2015-05-07 MED ORDER — METHYLPREDNISOLONE ACETATE 80 MG/ML IJ SUSP
120.0000 mg | Freq: Once | INTRAMUSCULAR | Status: AC
  Administered 2015-05-07: 120 mg via INTRAMUSCULAR

## 2015-05-07 MED ORDER — HYDROCOD POLST-CPM POLST ER 10-8 MG/5ML PO SUER: 5.0000 mL | Freq: Two times a day (BID) | ORAL | Status: DC | PRN

## 2015-05-07 NOTE — Progress Notes (Signed)
   Subjective:    Patient ID: Johnny Hopkins, male    DOB: 01/03/1966, 49 y.o.   MRN: [id number]  HPI Here for 4 days of sinus pressure, PND, HA, and coughing up green sputum. No fever. He has not taken anything else for this. No one else is sick.    Review of Systems  Constitutional: Negative.   HENT: Positive for congestion, postnasal drip and sinus pressure.   Eyes: Negative.   Respiratory: Positive for cough.        Objective:   Physical Exam  Constitutional: He appears well-developed and well-nourished.  HENT:  Right Ear: External ear normal.  Left Ear: External ear normal.  Nose: Nose normal.  Mouth/Throat: Oropharynx is clear and moist.  Eyes: Conjunctivae are normal.  Neck: No thyromegaly present.  Pulmonary/Chest: Effort normal and breath sounds normal.  Lymphadenopathy:    He has no cervical adenopathy.          Assessment & Plan:  Sinusitis . Given a Zpack and a steroid shot. Add Mucinex prn

## 2015-05-07 NOTE — Progress Notes (Signed)
Pre visit review using our clinic review tool, if applicable. No additional management support is needed unless otherwise documented below in the visit note. 

## 2015-05-07 NOTE — Addendum Note (Signed)
Addended by: Aggie Hacker A on: 05/07/2015 03:30 PM   Modules accepted: Orders

## 2015-09-09 ENCOUNTER — Other Ambulatory Visit: Payer: Self-pay

## 2015-09-09 MED ORDER — ALLOPURINOL 100 MG PO TABS: ORAL_TABLET | ORAL | Status: DC

## 2015-09-09 MED ORDER — MELOXICAM 15 MG PO TABS: 15.0000 mg | ORAL_TABLET | Freq: Every day | ORAL | Status: DC

## 2015-09-09 MED ORDER — LISINOPRIL 10 MG PO TABS: 10.0000 mg | ORAL_TABLET | Freq: Every day | ORAL | Status: DC

## 2015-09-11 ENCOUNTER — Other Ambulatory Visit: Payer: Self-pay | Admitting: Family Medicine

## 2016-01-27 ENCOUNTER — Ambulatory Visit (INDEPENDENT_AMBULATORY_CARE_PROVIDER_SITE_OTHER): Payer: 59 | Admitting: Family Medicine

## 2016-01-27 ENCOUNTER — Encounter: Payer: Self-pay | Admitting: Family Medicine

## 2016-01-27 VITALS — BP 130/88 | HR 61 | Temp 98.3°F | Ht 72.5 in | Wt 200.0 lb

## 2016-01-27 DIAGNOSIS — K645 Perianal venous thrombosis: Secondary | ICD-10-CM

## 2016-01-27 NOTE — Progress Notes (Signed)
   Subjective:    Patient ID: Johnny Hopkins, male    DOB: 23-Aug-1966, 50 y.o.   MRN: [id number]  HPI Here for one week of a painful bleeding hemorrhoid. He has tried witchhazel and Tucks pads. BMs are smooth and easy to pass.    Review of Systems  Constitutional: Negative.   Gastrointestinal: Positive for anal bleeding and rectal pain. Negative for abdominal pain.       Objective:   Physical Exam  Constitutional: He appears well-developed and well-nourished.  Genitourinary:  Single thrombosed hemorrhoid at the anal verge           Assessment & Plan:  Thrombosed hemorrhoid. This was excised today with a scalpel. We gave him advice about diet and drinking water. Recheck prn

## 2016-01-27 NOTE — Progress Notes (Signed)
Pre visit review using our clinic review tool, if applicable. No additional management support is needed unless otherwise documented below in the visit note. 

## 2016-02-18 MED FILL — MELOXICAM 15 MG TABLET: 15 | 90 days supply | Qty: 90 | Fill #1

## 2016-02-18 MED FILL — LISINOPRIL 10 MG TABLET: 10 | 90 days supply | Qty: 90 | Fill #1

## 2016-02-24 MED FILL — ALLOPURINOL 100 MG TABLET: 100 | 90 days supply | Qty: 180 | Fill #1

## 2016-03-12 ENCOUNTER — Other Ambulatory Visit: Payer: Self-pay | Admitting: Family Medicine

## 2016-03-12 NOTE — Telephone Encounter (Signed)
Pt need Rx refill for Flonase   Pharm:  [facility]

## 2016-03-13 MED ORDER — FLUTICASONE PROPIONATE 50 MCG/ACT NA SUSP: 2.0000 | Freq: Every day | NASAL | Status: DC

## 2016-03-13 NOTE — Telephone Encounter (Signed)
Rx is been sent

## 2016-03-26 MED FILL — FLUTICASONE PROP 50 MCG SPR: 50 | 30 days supply | Qty: 16 | Fill #0

## 2016-04-08 ENCOUNTER — Encounter: Payer: Self-pay | Admitting: Family Medicine

## 2016-04-08 ENCOUNTER — Telehealth: Payer: Self-pay | Admitting: Family Medicine

## 2016-04-08 ENCOUNTER — Ambulatory Visit (INDEPENDENT_AMBULATORY_CARE_PROVIDER_SITE_OTHER): Payer: 59 | Admitting: Family Medicine

## 2016-04-08 ENCOUNTER — Other Ambulatory Visit: Payer: Self-pay | Admitting: Family Medicine

## 2016-04-08 VITALS — BP 129/83 | HR 81 | Temp 97.9°F | Ht 72.5 in | Wt 196.0 lb

## 2016-04-08 DIAGNOSIS — M722 Plantar fascial fibromatosis: Secondary | ICD-10-CM | POA: Diagnosis not present

## 2016-04-08 MED ORDER — PREDNISONE 10 MG PO TABS: ORAL_TABLET | ORAL | Status: DC

## 2016-04-08 MED FILL — predniSONE 10 MG TABS: 10 | 12 days supply | Qty: 50 | Fill #0

## 2016-04-08 NOTE — Telephone Encounter (Signed)
I spoke with pt and he will check with pharmacy about the price. Pt will call us back if we need to make any changes.

## 2016-04-08 NOTE — Progress Notes (Signed)
   Subjective:    Patient ID: Johnny Hopkins, male    DOB: September 17, 1966, 50 y.o.   MRN: [id number]  HPI Here for one month of intermittent pain in the bottom of the left foot. No recent trauma. He takes Meloxicam daily and he has added Tylenol with little relief.    Review of Systems  Constitutional: Negative.   Musculoskeletal: Positive for myalgias.       Objective:   Physical Exam  Constitutional: He appears well-developed and well-nourished.  Musculoskeletal:  Tender over the left heel, no swelling           Assessment & Plan:  Plantar fasciitis, wear arch supports and try a Prednisone taper.  Laurey Morale, MD

## 2016-04-08 NOTE — Telephone Encounter (Signed)
Pharmacy would like to know if they can dispense generic for the Rx Crestor?

## 2016-04-08 NOTE — Progress Notes (Signed)
Pre visit review using our clinic review tool, if applicable. No additional management support is needed unless otherwise documented below in the visit note. 

## 2016-04-13 ENCOUNTER — Telehealth: Payer: Self-pay | Admitting: Family Medicine

## 2016-04-13 NOTE — Telephone Encounter (Signed)
Refill for one year, okay for generic

## 2016-04-13 NOTE — Telephone Encounter (Signed)
Pt would like you to know he is ok to switch to generic crestor. Pharmacy advise pt he would need to let Dr Sarajane Jews ok with him to switch.  Ruffin outpt pharm/[address]

## 2016-04-14 MED ORDER — ROSUVASTATIN CALCIUM 20 MG PO TABS: 20.0000 mg | ORAL_TABLET | Freq: Every day | ORAL | Status: DC

## 2016-04-14 MED FILL — ROSUVASTATIN CALCIUM 20 MG: 20 | 90 days supply | Qty: 90 | Fill #0

## 2016-04-14 NOTE — Telephone Encounter (Signed)
I sent script e-scribe and spoke with pt. 

## 2016-06-04 ENCOUNTER — Other Ambulatory Visit (INDEPENDENT_AMBULATORY_CARE_PROVIDER_SITE_OTHER): Payer: 59

## 2016-06-04 DIAGNOSIS — R7989 Other specified abnormal findings of blood chemistry: Secondary | ICD-10-CM | POA: Diagnosis not present

## 2016-06-04 DIAGNOSIS — Z0000 Encounter for general adult medical examination without abnormal findings: Secondary | ICD-10-CM

## 2016-06-04 LAB — POC URINALSYSI DIPSTICK (AUTOMATED)
BILIRUBIN UA: NEGATIVE
Blood, UA: NEGATIVE
GLUCOSE UA: NEGATIVE
Ketones, UA: NEGATIVE
LEUKOCYTES UA: NEGATIVE
NITRITE UA: NEGATIVE
Spec Grav, UA: >=1.03
Urobilinogen, UA: 0.2
pH, UA: 5.5

## 2016-06-04 LAB — BASIC METABOLIC PANEL
BUN: 14 mg/dL (ref 6–23)
CALCIUM: 9.3 mg/dL (ref 8.4–10.5)
CO2: 26 mEq/L (ref 19–32)
CREATININE: 0.82 mg/dL (ref 0.40–1.50)
Chloride: 109 mEq/L (ref 96–112)
GFR: 105.7 mL/min (ref >60.00)
GLUCOSE: 103 mg/dL — AB (ref 70–99)
Potassium: 4 mEq/L (ref 3.5–5.1)
SODIUM: 137 meq/L (ref 135–145)

## 2016-06-04 LAB — HEPATIC FUNCTION PANEL
ALK PHOS: 68 U/L (ref 39–117)
ALT: 39 U/L (ref 0–53)
AST: 26 U/L (ref 0–37)
Albumin: 4.5 g/dL (ref 3.5–5.2)
BILIRUBIN DIRECT: 0.1 mg/dL (ref 0.0–0.3)
BILIRUBIN TOTAL: 0.9 mg/dL (ref 0.2–1.2)
TOTAL PROTEIN: 6.8 g/dL (ref 6.0–8.3)

## 2016-06-04 LAB — LDL CHOLESTEROL, DIRECT: Direct LDL: 102 mg/dL

## 2016-06-04 LAB — CBC WITH DIFFERENTIAL/PLATELET
BASOS ABS: 0 10*3/uL (ref 0.0–0.1)
Basophils Relative: 0.8 % (ref 0.0–3.0)
EOS ABS: 0.3 10*3/uL (ref 0.0–0.7)
Eosinophils Relative: 5 % (ref 0.0–5.0)
HCT: 46.1 % (ref 39.0–52.0)
Hemoglobin: 15.9 g/dL (ref 13.0–17.0)
LYMPHS ABS: 2 10*3/uL (ref 0.7–4.0)
Lymphocytes Relative: 39.4 % (ref 12.0–46.0)
MCHC: 34.6 g/dL (ref 30.0–36.0)
MCV: 90.6 fl (ref 78.0–100.0)
MONOS PCT: 10.9 % (ref 3.0–12.0)
Monocytes Absolute: 0.5 10*3/uL (ref 0.1–1.0)
NEUTROS PCT: 43.9 % (ref 43.0–77.0)
Neutro Abs: 2.2 10*3/uL (ref 1.4–7.7)
PLATELETS: 206 10*3/uL (ref 150.0–400.0)
RBC: 5.09 Mil/uL (ref 4.22–5.81)
RDW: 12.9 % (ref 11.5–15.5)
WBC: 5 10*3/uL (ref 4.0–10.5)

## 2016-06-04 LAB — PSA: PSA: 0.3 ng/mL (ref 0.10–4.00)

## 2016-06-04 LAB — LIPID PANEL
CHOLESTEROL: 218 mg/dL — AB (ref 0–200)
HDL: 33.8 mg/dL — ABNORMAL LOW (ref >39.00)
Total CHOL/HDL Ratio: 6

## 2016-06-04 LAB — TSH: TSH: 2.03 u[IU]/mL (ref 0.35–4.50)

## 2016-06-08 ENCOUNTER — Telehealth: Payer: Self-pay | Admitting: Family Medicine

## 2016-06-08 ENCOUNTER — Encounter: Payer: 59 | Admitting: Family Medicine

## 2016-06-08 NOTE — Telephone Encounter (Signed)
Pt was on schedule for a physical today with Dr. Sarajane Jews. I called and left a voice message, advised pt to reschedule.

## 2016-06-17 IMAGING — CR DG STERNUM 2+V
3 series · 3 of 3 positions shown · non-contrast
Comparison: None.

CLINICAL DATA: Sternal pain and clicking for 6 months.

EXAM:
STERNUM - 2+ VIEW

[view not recorded (1 of 3)]
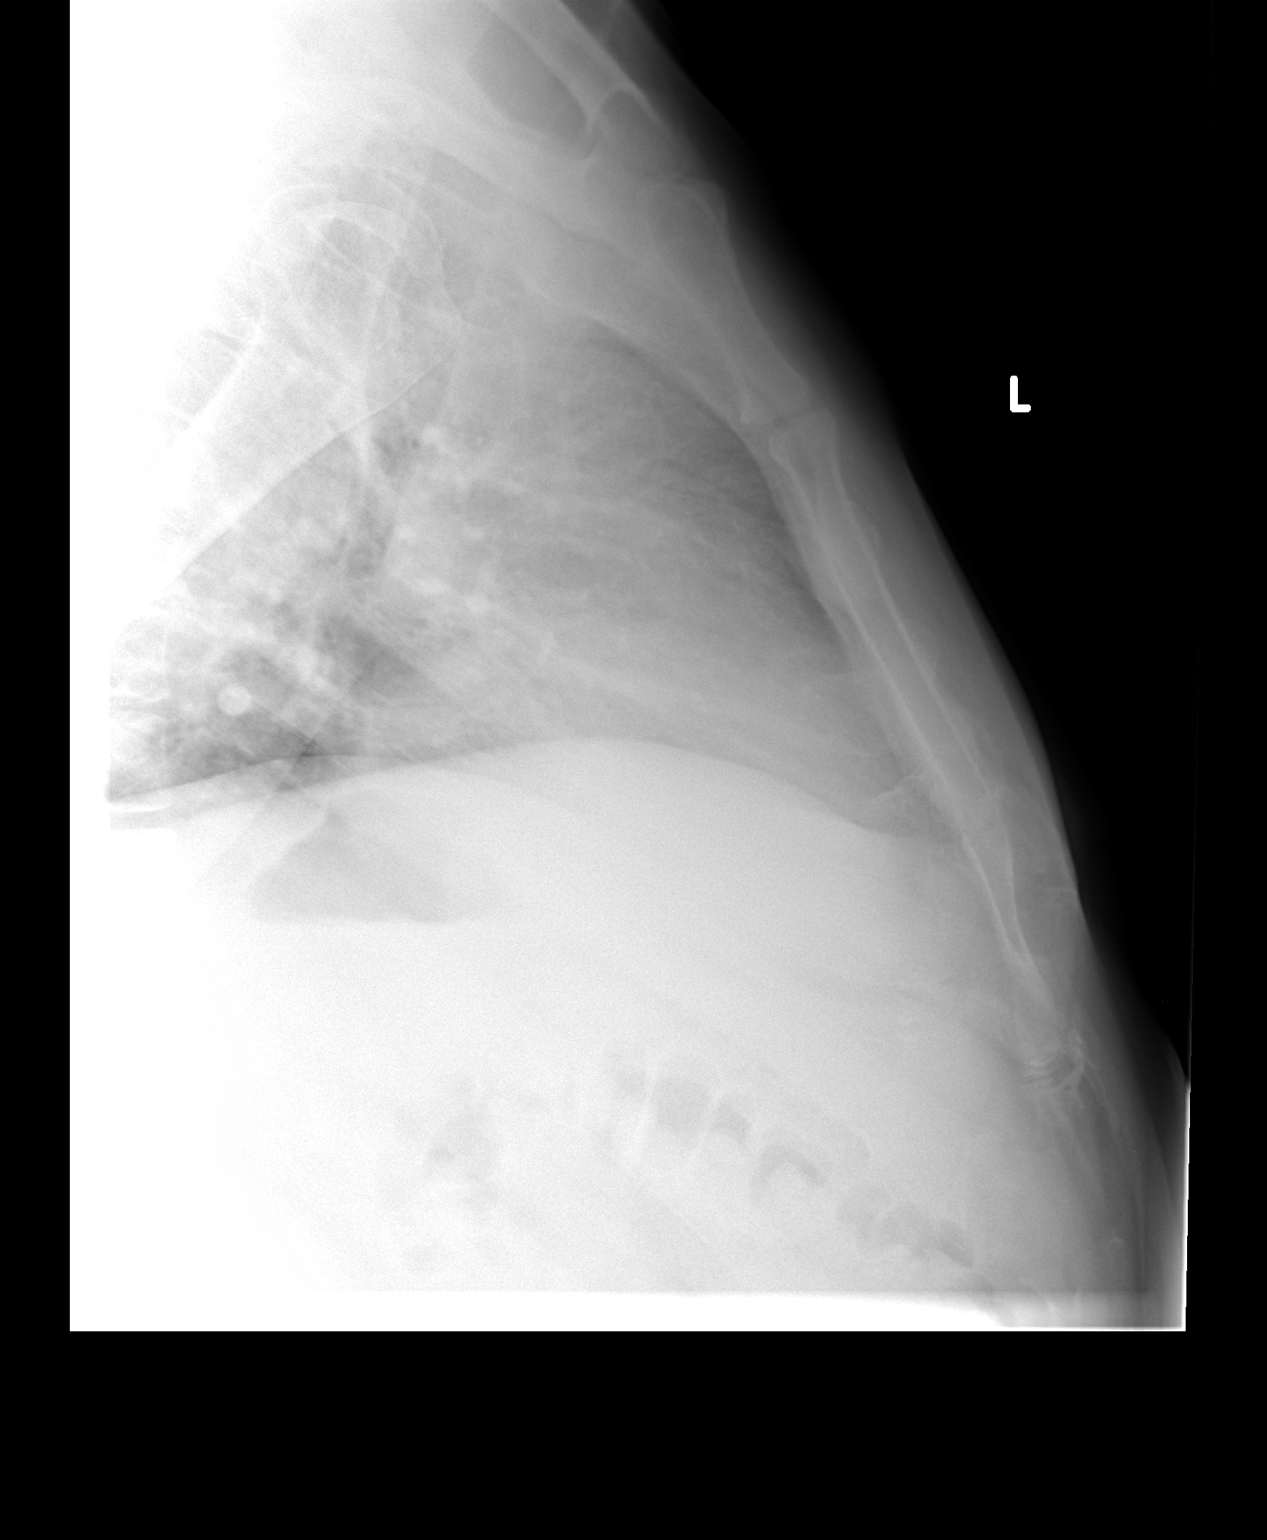

[view not recorded (2 of 3)]
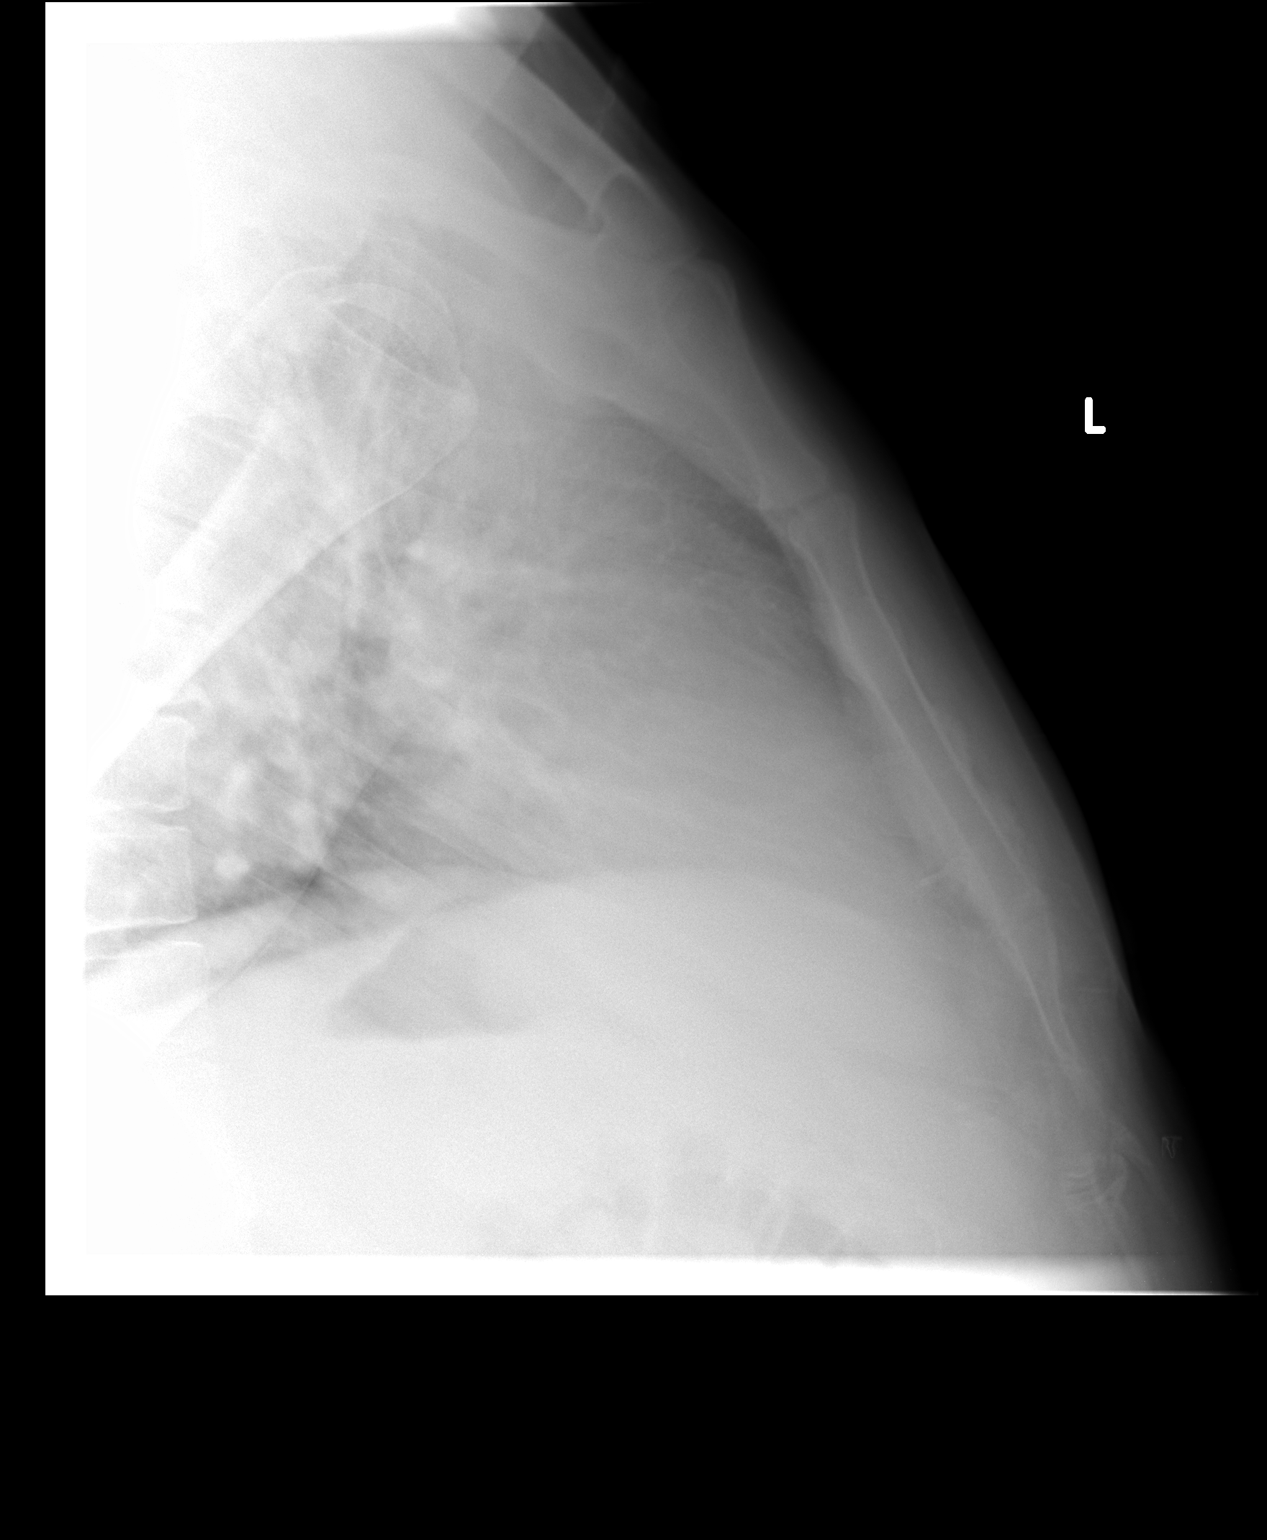

[view not recorded (3 of 3)]
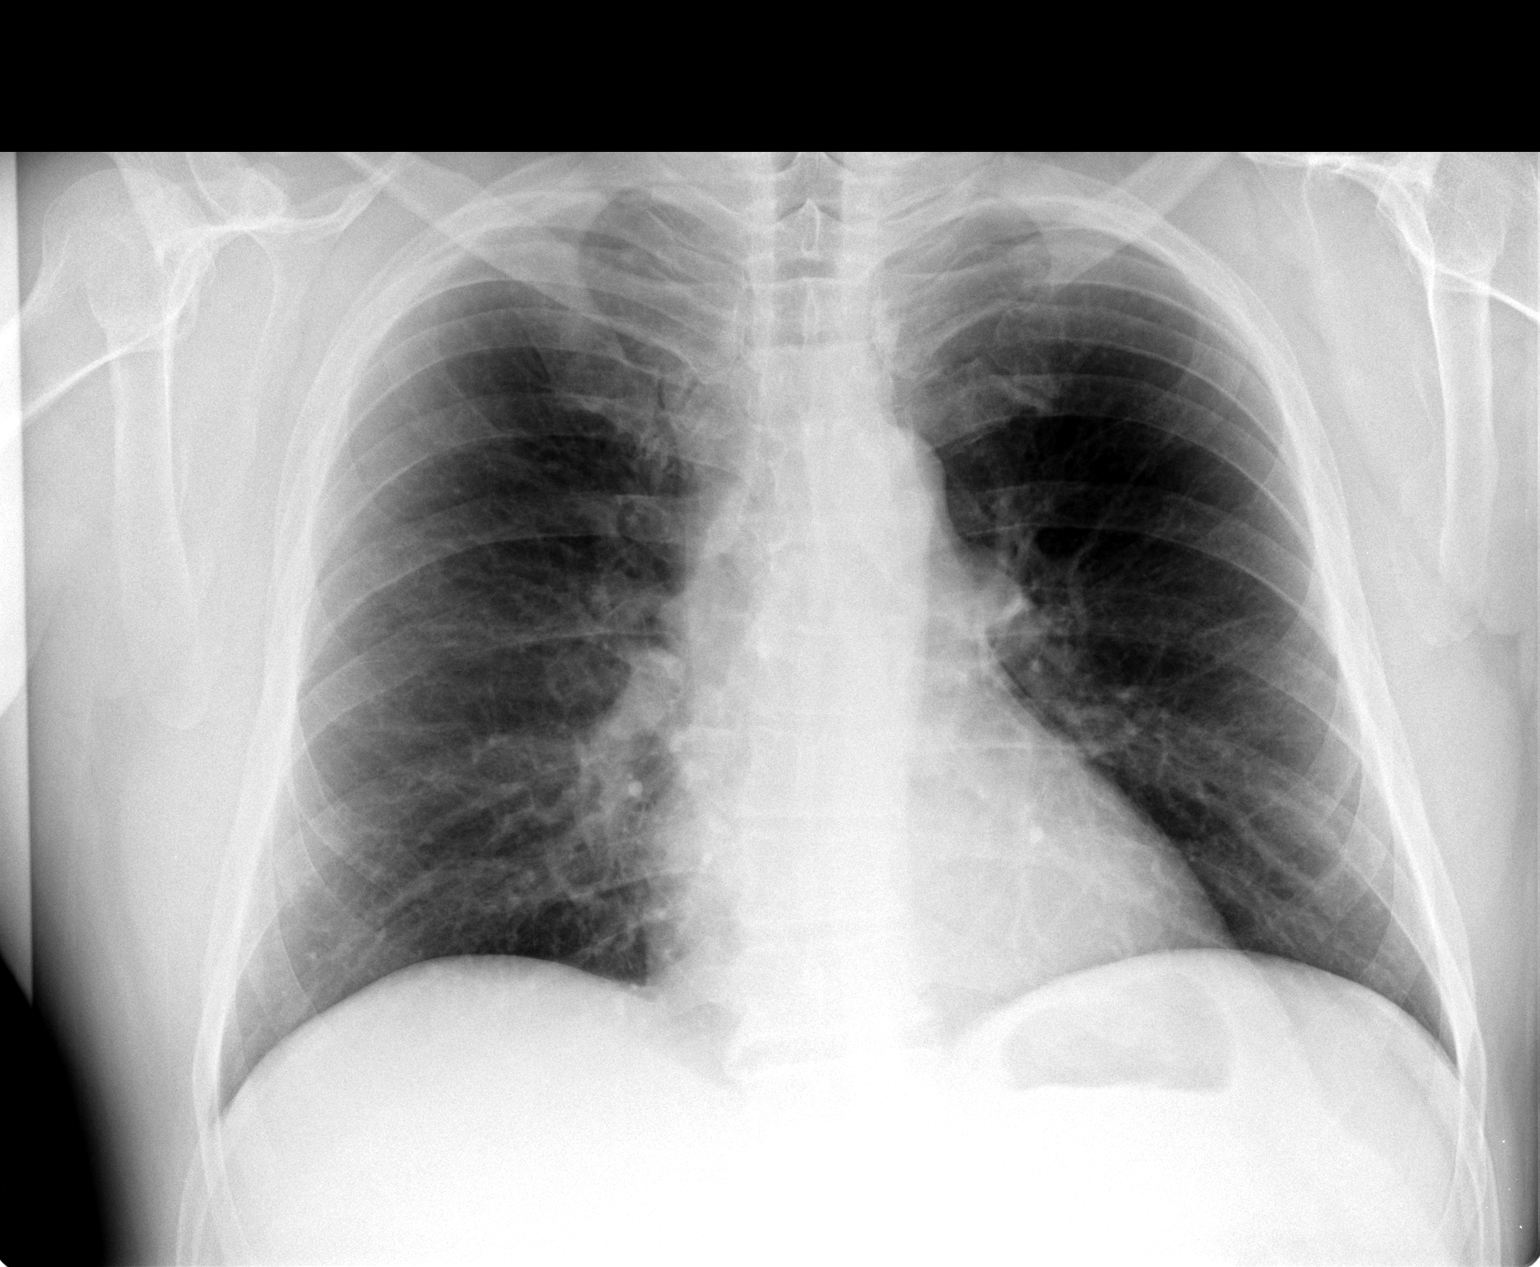

[3 of 3 positions shown; findings below may reference images not displayed]

FINDINGS: There is no fracture or bone destruction or subluxation. Heart size
and vascularity are normal. Lungs are clear. Old fracture of the
distal right clavicle.
IMPRESSION: Normal sternum.

## 2016-06-19 ENCOUNTER — Ambulatory Visit (INDEPENDENT_AMBULATORY_CARE_PROVIDER_SITE_OTHER): Payer: 59 | Admitting: Family Medicine

## 2016-06-19 ENCOUNTER — Encounter: Payer: Self-pay | Admitting: Family Medicine

## 2016-06-19 VITALS — BP 136/97 | HR 55 | Temp 97.8°F | Ht 72.5 in | Wt 201.0 lb

## 2016-06-19 DIAGNOSIS — Z0000 Encounter for general adult medical examination without abnormal findings: Secondary | ICD-10-CM

## 2016-06-19 MED ORDER — ROSUVASTATIN CALCIUM 20 MG PO TABS
20.0000 mg | ORAL_TABLET | Freq: Every day | ORAL | 3 refills | Status: DC
Start: 2016-06-19 — End: 2017-06-28

## 2016-06-19 MED ORDER — FENOFIBRATE 145 MG PO TABS: 145.0000 mg | ORAL_TABLET | Freq: Every day | ORAL | 3 refills | Status: DC

## 2016-06-19 MED ORDER — ALLOPURINOL 100 MG PO TABS: ORAL_TABLET | ORAL | 3 refills | Status: DC

## 2016-06-19 MED ORDER — LISINOPRIL 20 MG PO TABS: 20.0000 mg | ORAL_TABLET | Freq: Every day | ORAL | 3 refills | Status: DC

## 2016-06-19 MED ORDER — OMEPRAZOLE 40 MG PO CPDR: 40.0000 mg | DELAYED_RELEASE_CAPSULE | Freq: Every day | ORAL | 3 refills | Status: DC

## 2016-06-19 MED ORDER — MELOXICAM 15 MG PO TABS: 15.0000 mg | ORAL_TABLET | Freq: Every day | ORAL | 3 refills | Status: DC

## 2016-06-19 MED FILL — LISINOPRIL 20 MG TABLET: 20 | 90 days supply | Qty: 90 | Fill #0

## 2016-06-19 MED FILL — MELOXICAM 15 MG TABLET: 15 | 90 days supply | Qty: 90 | Fill #0

## 2016-06-19 MED FILL — ALLOPURINOL 100 MG TABLET: 100 | 90 days supply | Qty: 180 | Fill #0

## 2016-06-19 MED FILL — FENOFIBRATE 145 MG TABLET: 145 | 90 days supply | Qty: 90 | Fill #0

## 2016-06-19 MED FILL — OMEPRAZOLE DR 40 MG CAPSULE: 40 | 90 days supply | Qty: 90 | Fill #0

## 2016-06-19 NOTE — Progress Notes (Signed)
   Subjective:    Patient ID: Johnny Hopkins, male    DOB: Nov 29, 1965, 50 y.o.   MRN: [id number]  HPI 50 yr old male for a well exam. He feels well.    Review of Systems  Constitutional: Negative.   HENT: Negative.   Eyes: Negative.   Respiratory: Negative.   Cardiovascular: Negative.   Gastrointestinal: Negative.   Genitourinary: Negative.   Musculoskeletal: Negative.   Skin: Negative.   Neurological: Negative.   Psychiatric/Behavioral: Negative.        Objective:   Physical Exam  Constitutional: He is oriented to person, place, and time. He appears well-developed and well-nourished. No distress.  HENT:  Head: Normocephalic and atraumatic.  Right Ear: External ear normal.  Left Ear: External ear normal.  Nose: Nose normal.  Mouth/Throat: Oropharynx is clear and moist. No oropharyngeal exudate.  Eyes: Conjunctivae and EOM are normal. Pupils are equal, round, and reactive to light. Right eye exhibits no discharge. Left eye exhibits no discharge. No scleral icterus.  Neck: Neck supple. No JVD present. No tracheal deviation present. No thyromegaly present.  Cardiovascular: Normal rate, regular rhythm, normal heart sounds and intact distal pulses.  Exam reveals no gallop and no friction rub.   No murmur heard. EKG normal   Pulmonary/Chest: Effort normal and breath sounds normal. No respiratory distress. He has no wheezes. He has no rales. He exhibits no tenderness.  Abdominal: Soft. Bowel sounds are normal. He exhibits no distension and no mass. There is no tenderness. There is no rebound and no guarding.  Genitourinary: Rectum normal, prostate normal and penis normal. Rectal exam shows guaiac negative stool. No penile tenderness.  Musculoskeletal: Normal range of motion. He exhibits no edema or tenderness.  Lymphadenopathy:    He has no cervical adenopathy.  Neurological: He is alert and oriented to person, place, and time. He has normal reflexes. No cranial nerve deficit. He  exhibits normal muscle tone. Coordination normal.  Skin: Skin is warm and dry. No rash noted. He is not diaphoretic. No erythema. No pallor.  Psychiatric: He has a normal mood and affect. His behavior is normal. Judgment and thought content normal.          Assessment & Plan:  Well exam. We discussed diet and exercise. Set up a colonoscopy. Add Fenofibrate to the statin.  Laurey Morale, MD

## 2016-06-19 NOTE — Progress Notes (Signed)
Pre visit review using our clinic review tool, if applicable. No additional management support is needed unless otherwise documented below in the visit note. 

## 2016-06-25 ENCOUNTER — Encounter: Payer: Self-pay | Admitting: Internal Medicine

## 2016-08-13 ENCOUNTER — Ambulatory Visit (AMBULATORY_SURGERY_CENTER): Payer: Self-pay

## 2016-08-13 VITALS — Ht 73.0 in | Wt 199.4 lb

## 2016-08-13 DIAGNOSIS — Z1211 Encounter for screening for malignant neoplasm of colon: Secondary | ICD-10-CM

## 2016-08-13 MED ORDER — SUPREP BOWEL PREP KIT 17.5-3.13-1.6 GM/177ML PO SOLN: 1.0000 | Freq: Once | ORAL | 0 refills | Status: AC

## 2016-08-13 NOTE — Progress Notes (Signed)
No allergies to eggs or soy No past problems with anesthesia No diet meds No home oxygen  Declined emmi 

## 2016-08-26 ENCOUNTER — Encounter: Payer: Self-pay | Admitting: *Deleted

## 2016-08-26 ENCOUNTER — Encounter: Payer: Self-pay | Admitting: Internal Medicine

## 2016-08-26 ENCOUNTER — Telehealth: Payer: Self-pay | Admitting: Internal Medicine

## 2016-08-26 MED FILL — SUPREP BOWEL PREP KIT: 17.5-3.13-1 | 1 days supply | Qty: 354 | Fill #0

## 2016-08-26 NOTE — Telephone Encounter (Signed)
per Dr Hilarie Fredrickson- needs OV- pt wants egd w/colon for screening- pt has bloating, Left abd pain, inc.bleching, early satiety - prn prilosec per PCP--- canc 10-5 colon and make Ov  per JMP- pt with no gi hx so OV per pyrtle- OV made w/ pt-  Instructed pt to hold on to suprep until we get him RS after his OV-- scheduled with J zehr for 09-03-16 at 230 pm  Instructed pt to check in at 215 Thursday 3rd floor [facility]  - cancelled 10-5 colon   Lelan Pons PV

## 2016-08-27 ENCOUNTER — Encounter: Payer: 59 | Admitting: Internal Medicine

## 2016-09-03 ENCOUNTER — Encounter: Payer: Self-pay | Admitting: Gastroenterology

## 2016-09-03 ENCOUNTER — Ambulatory Visit (INDEPENDENT_AMBULATORY_CARE_PROVIDER_SITE_OTHER): Payer: 59 | Admitting: Gastroenterology

## 2016-09-03 VITALS — BP 138/84 | HR 76 | Ht 73.0 in | Wt 200.1 lb

## 2016-09-03 DIAGNOSIS — Z1211 Encounter for screening for malignant neoplasm of colon: Secondary | ICD-10-CM | POA: Diagnosis not present

## 2016-09-03 DIAGNOSIS — Z791 Long term (current) use of non-steroidal anti-inflammatories (NSAID): Secondary | ICD-10-CM

## 2016-09-03 DIAGNOSIS — R1012 Left upper quadrant pain: Secondary | ICD-10-CM | POA: Diagnosis not present

## 2016-09-03 DIAGNOSIS — K219 Gastro-esophageal reflux disease without esophagitis: Secondary | ICD-10-CM | POA: Diagnosis not present

## 2016-09-03 NOTE — Progress Notes (Addendum)
09/03/2016 Johnny Hopkins [id number] 25-Dec-1965   HISTORY OF PRESENT ILLNESS:  This is a 50 year old male who was scheduled to have colonoscopy with Dr. Hilarie Fredrickson.  Then he was having some upper GI complaints and was questioning if he could have an EGD at the same time so he was scheduled for this appointment to discuss that further. He tells me that he has been having some discomfort in his left upper quadrant and bloating sensation after he eats. Says that he thinks it has been going on for years. Seems to be worse if he eats a larger amount. He does have reflux symptoms dating back several years. Previously took Nexium for some time, but most recently has only been taking omeprazole 40 mg daily couple times a month as needed. He does take meloxicam 15 mg daily as needed for arthritis type pain.   Past Medical History:  Diagnosis Date  . Allergy   . Gout   . Hemorrhoid   . Hyperlipidemia    Past Surgical History:  Procedure Laterality Date  . CLAVICLE SURGERY     right  . EXCISIONAL HEMORRHOIDECTOMY    . FRACTURE SURGERY     compound fracture right hand  . WISDOM TOOTH EXTRACTION      reports that he has never smoked. He has never used smokeless tobacco. He reports that he drinks alcohol. He reports that he does not use drugs. family history includes Heart disease (age of onset: 18) in his father. Allergies  Allergen Reactions  . Peanut-Containing Drug Products     REACTION: anaphylaxis  . Penicillins     REACTION: hives      Outpatient Encounter Prescriptions as of 09/03/2016  Medication Sig  . allopurinol (ZYLOPRIM) 100 MG tablet Take 2 tablets daily  . fenofibrate (TRICOR) 145 MG tablet Take 1 tablet (145 mg total) by mouth daily.  Marland Kitchen lisinopril (PRINIVIL,ZESTRIL) 20 MG tablet Take 1 tablet (20 mg total) by mouth daily.  . meloxicam (MOBIC) 15 MG tablet Take 1 tablet (15 mg total) by mouth daily.  . methocarbamol (ROBAXIN) 500 MG tablet Take 1 tablet (500 mg total) by  mouth 4 (four) times daily as needed.  Marland Kitchen omega-3 acid ethyl esters (LOVAZA) 1 G capsule Take 1 capsule (1 g total) by mouth 2 (two) times daily.  Marland Kitchen omeprazole (PRILOSEC) 40 MG capsule Take 1 capsule (40 mg total) by mouth daily. (Patient taking differently: Take 40 mg by mouth daily as needed. )  . rosuvastatin (CRESTOR) 20 MG tablet Take 1 tablet (20 mg total) by mouth daily.   No facility-administered encounter medications on file as of 09/03/2016.      REVIEW OF SYSTEMS  : All other systems reviewed and negative except where noted in the History of Present Illness.   PHYSICAL EXAM: BP 138/84   Pulse 76   Ht 6\' 1"  (1.854 m)   Wt 200 lb 2 oz (90.8 kg)   BMI 26.40 kg/m  General: Well developed white male in no acute distress Head: Normocephalic and atraumatic Eyes:  Sclerae anicteric, conjunctiva pink. Ears: Normal auditory acuity Lungs: Clear throughout to auscultation Heart: Regular rate and rhythm Abdomen: Soft, non-distended.  Normal bowel sounds.  Mild LUQ TTP. Rectal:  Will be done at the time of colonoscopy. Musculoskeletal: Symmetrical with no gross deformities  Skin: No lesions on visible extremities Extremities: No edema  Neurological: Alert oriented x 4, grossly non-focal Psychological:  Alert and cooperative. Normal mood and affect  ASSESSMENT AND PLAN: -Screening colonoscopy:  Will schedule with Dr. Hilarie Fredrickson. -Long standing GERD with chronic NSAID use and LUQ abdominal pain:  Will schedule for EGD as well to rule out ulcer, gastritis, etc.  In the interim I have asked him to begin taking his PPI daily.    *The risks, benefits, and alternatives to EGD and colonoscopy were discussed with the patient and he consents to proceed.    CC:  Laurey Morale, MD  Addendum: Reviewed and agree with initial management. Jerene Bears, MD

## 2016-09-03 NOTE — Patient Instructions (Addendum)
You have been scheduled for an endoscopy and colonoscopy. Please follow the written instructions given to you at your visit today. Please pick up your prep supplies at the pharmacy within the next 1-3 days. If you use inhalers (even only as needed), please bring them with you on the day of your procedure.  Please take Omeprazole 40 mg once daily.

## 2016-10-19 ENCOUNTER — Encounter: Payer: 59 | Admitting: Internal Medicine

## 2016-11-27 DIAGNOSIS — H524 Presbyopia: Secondary | ICD-10-CM | POA: Diagnosis not present

## 2016-11-27 DIAGNOSIS — H5213 Myopia, bilateral: Secondary | ICD-10-CM | POA: Diagnosis not present

## 2016-12-01 ENCOUNTER — Other Ambulatory Visit: Payer: Self-pay | Admitting: Family Medicine

## 2016-12-01 MED FILL — MELOXICAM 15 MG TABLET: 15 | 90 days supply | Qty: 90 | Fill #1

## 2016-12-01 MED FILL — LISINOPRIL 20 MG TABLET: 20 | 90 days supply | Qty: 90 | Fill #1

## 2016-12-01 MED FILL — ALLOPURINOL 100 MG TABLET: 100 | 90 days supply | Qty: 180 | Fill #1

## 2016-12-01 MED FILL — ROSUVASTATIN CALCIUM 20 MG: 20 | 90 days supply | Qty: 90 | Fill #0

## 2016-12-01 MED FILL — OMEGA-3 ETHYL ESTERS 1 GM C: 1 | 90 days supply | Qty: 180 | Fill #0

## 2016-12-01 MED FILL — OMEPRAZOLE DR 40 MG CAPSULE: 40 | 90 days supply | Qty: 90 | Fill #1

## 2016-12-01 MED FILL — FENOFIBRATE 145 MG TABLET: 145 | 90 days supply | Qty: 90 | Fill #1

## 2016-12-04 MED FILL — FLUTICASONE PROP 50 MCG SPR: 50 | 30 days supply | Qty: 16 | Fill #1

## 2016-12-21 ENCOUNTER — Ambulatory Visit (AMBULATORY_SURGERY_CENTER): Payer: 59 | Admitting: Internal Medicine

## 2016-12-21 ENCOUNTER — Encounter: Payer: Self-pay | Admitting: Internal Medicine

## 2016-12-21 VITALS — BP 124/84 | HR 60 | Temp 98.7°F | Resp 14 | Ht 73.0 in | Wt 200.0 lb

## 2016-12-21 DIAGNOSIS — R1013 Epigastric pain: Secondary | ICD-10-CM

## 2016-12-21 DIAGNOSIS — I10 Essential (primary) hypertension: Secondary | ICD-10-CM | POA: Diagnosis not present

## 2016-12-21 DIAGNOSIS — D129 Benign neoplasm of anus and anal canal: Secondary | ICD-10-CM

## 2016-12-21 DIAGNOSIS — D127 Benign neoplasm of rectosigmoid junction: Secondary | ICD-10-CM

## 2016-12-21 DIAGNOSIS — Z1212 Encounter for screening for malignant neoplasm of rectum: Secondary | ICD-10-CM

## 2016-12-21 DIAGNOSIS — K621 Rectal polyp: Secondary | ICD-10-CM | POA: Diagnosis not present

## 2016-12-21 DIAGNOSIS — K635 Polyp of colon: Secondary | ICD-10-CM | POA: Diagnosis not present

## 2016-12-21 DIAGNOSIS — Z1211 Encounter for screening for malignant neoplasm of colon: Secondary | ICD-10-CM | POA: Diagnosis present

## 2016-12-21 DIAGNOSIS — K219 Gastro-esophageal reflux disease without esophagitis: Secondary | ICD-10-CM

## 2016-12-21 DIAGNOSIS — D128 Benign neoplasm of rectum: Secondary | ICD-10-CM

## 2016-12-21 HISTORY — PX: UPPER GASTROINTESTINAL ENDOSCOPY: SHX188

## 2016-12-21 HISTORY — PX: COLONOSCOPY: SHX174

## 2016-12-21 MED ORDER — SODIUM CHLORIDE 0.9 % IV SOLN: 500.0000 mL | INTRAVENOUS | Status: DC

## 2016-12-21 NOTE — Progress Notes (Signed)
Spontaneous respirations throughout. VSS. Resting comfortably. To PACU on room air. Report to  Annette RN.  

## 2016-12-21 NOTE — Progress Notes (Signed)
No problems noted in the recovery room. maw 

## 2016-12-21 NOTE — Op Note (Signed)
[facility] Patient Name: Johnny Hopkins Procedure Date: 12/21/2016 9:34 AM MRN: [id number] Endoscopist: Jerene Bears , MD Age: 51 Referring MD:  Date of Birth: Nov 09, 1966 Gender: Male Account #: [id number] Procedure:                Colonoscopy Indications:              Screening for colorectal malignant neoplasm, This                            is the patient's first colonoscopy Medicines:                Monitored Anesthesia Care Procedure:                Pre-Anesthesia Assessment:                           - Prior to the procedure, a History and Physical                            was performed, and patient medications and                            allergies were reviewed. The patient's tolerance of                            previous anesthesia was also reviewed. The risks                            and benefits of the procedure and the sedation                            options and risks were discussed with the patient.                            All questions were answered, and informed consent                            was obtained. Prior Anticoagulants: The patient has                            taken no previous anticoagulant or antiplatelet                            agents. ASA Grade Assessment: II - A patient with                            mild systemic disease. After reviewing the risks                            and benefits, the patient was deemed in                            satisfactory condition to undergo the procedure.  After obtaining informed consent, the colonoscope                            was passed under direct vision. Throughout the                            procedure, the patient's blood pressure, pulse, and                            oxygen saturations were monitored continuously. The                            Model CF-HQ190L [id number]) scope was introduced                            through the anus and advanced to  the the cecum,                            identified by appendiceal orifice and ileocecal                            valve. The colonoscopy was performed without                            difficulty. The patient tolerated the procedure                            well. The quality of the bowel preparation was                            excellent. The ileocecal valve, appendiceal                            orifice, and rectum were photographed. Scope In: 9:52:18 AM Scope Out: 10:06:37 AM Scope Withdrawal Time: 0 hours 10 minutes 42 seconds  Total Procedure Duration: 0 hours 14 minutes 19 seconds  Findings:                 The digital rectal exam was normal.                           Two sessile polyps were found in the rectum and                            recto-sigmoid colon. The polyps were 3 to 4 mm in                            size. These polyps were removed with a cold snare.                            Resection and retrieval were complete.                           The exam was otherwise without abnormality on  direct and retroflexion views. Complications:            No immediate complications. Estimated Blood Loss:     Estimated blood loss: none. Impression:               - Two 3 to 4 mm polyps in the rectum and at the                            recto-sigmoid colon, removed with a cold snare.                            Resected and retrieved.                           - The examination was otherwise normal on direct                            and retroflexion views. Recommendation:           - Patient has a contact number available for                            emergencies. The signs and symptoms of potential                            delayed complications were discussed with the                            patient. Return to normal activities tomorrow.                            Written discharge instructions were provided to the                             patient.                           - Resume previous diet.                           - Await pathology results.                           - Repeat colonoscopy date to be determined after                            pending pathology results are reviewed.                           - Continue present medications. Jerene Bears, MD 12/21/2016 10:14:04 AM This report has been signed electronically.

## 2016-12-21 NOTE — Op Note (Signed)
[facility] Patient Name: Johnny Hopkins Procedure Date: 12/21/2016 9:35 AM MRN: [id number] Endoscopist: Jerene Bears , MD Age: 51 Referring MD:  Date of Birth: Apr 15, 1966 Gender: Male Account #: [id number] Procedure:                Upper GI endoscopy Indications:              Abdominal pain in the left upper quadrant,                            Suspected gastro-esophageal reflux disease Medicines:                Monitored Anesthesia Care Procedure:                Pre-Anesthesia Assessment:                           - Prior to the procedure, a History and Physical                            was performed, and patient medications and                            allergies were reviewed. The patient's tolerance of                            previous anesthesia was also reviewed. The risks                            and benefits of the procedure and the sedation                            options and risks were discussed with the patient.                            All questions were answered, and informed consent                            was obtained. Prior Anticoagulants: The patient has                            taken no previous anticoagulant or antiplatelet                            agents. ASA Grade Assessment: II - A patient with                            mild systemic disease. After reviewing the risks                            and benefits, the patient was deemed in                            satisfactory condition to undergo the procedure.  After obtaining informed consent, the endoscope was                            passed under direct vision. Throughout the                            procedure, the patient's blood pressure, pulse, and                            oxygen saturations were monitored continuously. The                            Model GIF-HQ190 [id number]) scope was introduced                            through the mouth, and  advanced to the second part                            of duodenum. The upper GI endoscopy was                            accomplished without difficulty. The patient                            tolerated the procedure well. Scope In: Scope Out: Findings:                 The esophagus was normal.                           The stomach was normal.                           The examined duodenum was normal. Complications:            No immediate complications. Estimated Blood Loss:     Estimated blood loss: none. Impression:               - Normal esophagus.                           - Normal stomach.                           - Normal examined duodenum.                           - No specimens collected. Recommendation:           - Patient has a contact number available for                            emergencies. The signs and symptoms of potential                            delayed complications were discussed with the  patient. Return to normal activities tomorrow.                            Written discharge instructions were provided to the                            patient.                           - Resume previous diet.                           - Continue present medications.                           - See the other procedure note for documentation of                            additional recommendations. Jerene Bears, MD 12/21/2016 10:11:41 AM This report has been signed electronically.

## 2016-12-21 NOTE — Patient Instructions (Signed)
YOU HAD AN ENDOSCOPIC PROCEDURE TODAY AT THE [facility]:   Refer to the procedure report that was given to you for any specific questions about what was found during the examination.  If the procedure report does not answer your questions, please call your gastroenterologist to clarify.  If you requested that your care partner not be given the details of your procedure findings, then the procedure report has been included in a sealed envelope for you to review at your convenience later.  YOU SHOULD EXPECT: Some feelings of bloating in the abdomen. Passage of more gas than usual.  Walking can help get rid of the air that was put into your GI tract during the procedure and reduce the bloating. If you had a lower endoscopy (such as a colonoscopy or flexible sigmoidoscopy) you may notice spotting of blood in your stool or on the toilet paper. If you underwent a bowel prep for your procedure, you may not have a normal bowel movement for a few days.  Please Note:  You might notice some irritation and congestion in your nose or some drainage.  This is from the oxygen used during your procedure.  There is no need for concern and it should clear up in a day or so.  SYMPTOMS TO REPORT IMMEDIATELY:   Following lower endoscopy (colonoscopy or flexible sigmoidoscopy):  Excessive amounts of blood in the stool  Significant tenderness or worsening of abdominal pains  Swelling of the abdomen that is new, acute  Fever of 100F or higher   Following upper endoscopy (EGD)  Vomiting of blood or coffee ground material  New chest pain or pain under the shoulder blades  Painful or persistently difficult swallowing  New shortness of breath  Fever of 100F or higher  Black, tarry-looking stools  For urgent or emergent issues, a gastroenterologist can be reached at any hour by calling [phone].   DIET:  We do recommend a small meal at first, but then you may proceed to your regular diet.  Drink  plenty of fluids but you should avoid alcoholic beverages for 24 hours.  ACTIVITY:  You should plan to take it easy for the rest of today and you should NOT DRIVE or use heavy machinery until tomorrow (because of the sedation medicines used during the test).    FOLLOW UP: Our staff will call the number listed on your records the next business day following your procedure to check on you and address any questions or concerns that you may have regarding the information given to you following your procedure. If we do not reach you, we will leave a message.  However, if you are feeling well and you are not experiencing any problems, there is no need to return our call.  We will assume that you have returned to your regular daily activities without incident.  If any biopsies were taken you will be contacted by phone or by letter within the next 1-3 weeks.  Please call us at [phone] if you have not heard about the biopsies in 3 weeks.    SIGNATURES/CONFIDENTIALITY: You and/or your care partner have signed paperwork which will be entered into your electronic medical record.  These signatures attest to the fact that that the information above on your After Visit Summary has been reviewed and is understood.  Full responsibility of the confidentiality of this discharge information lies with you and/or your care-partner.    Handout was given to your care partner on polyps.   You may resume your current medications today. Await biopsy results. Please call if any questions or concerns.   

## 2016-12-21 NOTE — Progress Notes (Signed)
Called to room to assist during endoscopic procedure.  Patient ID and intended procedure confirmed with present staff. Received instructions for my participation in the procedure from the performing physician.  

## 2016-12-22 ENCOUNTER — Telehealth: Payer: Self-pay | Admitting: *Deleted

## 2016-12-22 NOTE — Telephone Encounter (Signed)
  Follow up Call-  Call back number 12/21/2016  Post procedure Call Back phone  # [phone]  Permission to leave phone message Yes  Some recent data might be hidden     Patient questions:  Do you have a fever, pain , or abdominal swelling? No. Pain Score  0 *  Have you tolerated food without any problems? Yes.    Have you been able to return to your normal activities? Yes.    Do you have any questions about your discharge instructions: Diet   No. Medications  No. Follow up visit  No.  Do you have questions or concerns about your Care? No.  Actions: * If pain score is 4 or above: No action needed, pain <4.

## 2016-12-29 ENCOUNTER — Encounter: Payer: Self-pay | Admitting: Internal Medicine

## 2017-02-16 ENCOUNTER — Ambulatory Visit: Payer: 59 | Admitting: Family Medicine

## 2017-02-17 ENCOUNTER — Encounter: Payer: Self-pay | Admitting: Family Medicine

## 2017-02-17 ENCOUNTER — Ambulatory Visit (INDEPENDENT_AMBULATORY_CARE_PROVIDER_SITE_OTHER): Payer: 59 | Admitting: Family Medicine

## 2017-02-17 VITALS — BP 150/98 | HR 81 | Temp 98.3°F | Ht 73.0 in | Wt 203.0 lb

## 2017-02-17 DIAGNOSIS — K645 Perianal venous thrombosis: Secondary | ICD-10-CM | POA: Diagnosis not present

## 2017-02-17 NOTE — Patient Instructions (Signed)
WE NOW OFFER   [facility] FAST TRACK!!!  SAME DAY Appointments for ACUTE CARE  Such as: Sprains, Injuries, cuts, abrasions, rashes, muscle pain, joint pain, back pain Colds, flu, sore throats, headache, allergies, cough, fever  Ear pain, sinus and eye infections Abdominal pain, nausea, vomiting, diarrhea, upset stomach Animal/insect bites  3 Easy Ways to Schedule: Walk-In Scheduling Call in scheduling Mychart Sign-up: [email or web address]         

## 2017-02-17 NOTE — Progress Notes (Signed)
Pre visit review using our clinic review tool, if applicable. No additional management support is needed unless otherwise documented below in the visit note. 

## 2017-02-17 NOTE — Progress Notes (Signed)
   Subjective:    Patient ID: Johnny Hopkins, male    DOB: Dec 29, 1965, 51 y.o.   MRN: [id number]  HPI Here for 4 days of intermittent bleeding per rectum. Sometimes this is associated with passing a stool. But not always. There is not rectal pain. He has a hx of hemorrhoids. He has tried Prep H and Neosporin with no effect.    Review of Systems  Constitutional: Negative.   HENT: Negative.   Eyes: Negative.   Respiratory: Negative.   Cardiovascular: Negative.   Gastrointestinal: Positive for abdominal pain. Negative for abdominal distention, anal bleeding, blood in stool, constipation, diarrhea, nausea, rectal pain and vomiting.       Objective:   Physical Exam  Constitutional: He appears well-developed and well-nourished.  Cardiovascular: Normal rate, regular rhythm, normal heart sounds and intact distal pulses.   Pulmonary/Chest: Effort normal and breath sounds normal.  Abdominal: Soft. Bowel sounds are normal. He exhibits no distension and no mass. There is no tenderness. There is no rebound and no guarding.  Genitourinary:  Genitourinary Comments: There is an external thrombosed hemorrhoid at the anal verge.          Assessment & Plan:  The hemorrhoid was lanced with a scalpel and thrombus was removed. Apply witch hazel prn.  Alysia Penna, MD

## 2017-06-28 ENCOUNTER — Other Ambulatory Visit: Payer: Self-pay | Admitting: Family Medicine

## 2017-06-28 MED FILL — OMEGA-3 ETHYL ESTERS 1 GM C: 1 | 90 days supply | Qty: 180 | Fill #1

## 2017-06-29 MED FILL — METHOCARBAMOL 500 MG TABLET: 500 | 15 days supply | Qty: 60 | Fill #0

## 2017-06-29 MED FILL — ROSUVASTATIN CALCIUM 20 MG: 20 | 90 days supply | Qty: 90 | Fill #0

## 2017-06-29 MED FILL — ALLOPURINOL 100 MG TABLET: 100 | 90 days supply | Qty: 180 | Fill #0

## 2017-06-29 MED FILL — MELOXICAM 15 MG TABLET: 15 | 90 days supply | Qty: 90 | Fill #0

## 2017-06-29 MED FILL — FLUTICASONE PROP 50 MCG SPR: 50 | 30 days supply | Qty: 16 | Fill #0

## 2017-06-29 MED FILL — FENOFIBRATE 145 MG TAB: 145 | 90 days supply | Qty: 90 | Fill #0

## 2017-06-29 MED FILL — LISINOPRIL 20 MG TAB: 20 | 90 days supply | Qty: 90 | Fill #0

## 2017-06-29 MED FILL — OMEPRAZOLE DR 40 MG CAPSULE: 40 | 90 days supply | Qty: 90 | Fill #0

## 2017-06-29 NOTE — Telephone Encounter (Signed)
Flonase is not listed on the pts current med list and the last refill on Methocarbamol was given on 08/31/2014.

## 2017-08-12 ENCOUNTER — Encounter: Payer: Self-pay | Admitting: Family Medicine

## 2017-11-29 ENCOUNTER — Other Ambulatory Visit: Payer: Self-pay | Admitting: Family Medicine

## 2017-11-30 MED FILL — MELOXICAM 15 MG TABLET: 15 | 90 days supply | Qty: 90 | Fill #0

## 2017-11-30 MED FILL — LISINOPRIL 20 MG TABLET: 20 | 90 days supply | Qty: 90 | Fill #0

## 2017-11-30 NOTE — Telephone Encounter (Signed)
Last OV 02/17/2017. Meloxicam was last refilled 06/29/2017 disp 90 with no refills. Sent to PCP for approval.

## 2018-01-12 ENCOUNTER — Other Ambulatory Visit: Payer: Self-pay | Admitting: Family Medicine

## 2018-01-12 NOTE — Telephone Encounter (Signed)
Last OV 02/17/2017  Both Rx's last refilled 06/29/2017 disp 90 with no refills

## 2018-01-12 NOTE — Telephone Encounter (Signed)
Pt needs to schedule for an OV for more refills thanks

## 2018-01-25 MED FILL — FENOFIBRATE 145 MG TAB: 145 | 90 days supply | Qty: 90 | Fill #0

## 2018-01-25 MED FILL — ROSUVASTATIN CALCIUM 20 MG: 20 | 90 days supply | Qty: 90 | Fill #0

## 2018-02-02 DIAGNOSIS — L281 Prurigo nodularis: Secondary | ICD-10-CM | POA: Diagnosis not present

## 2018-02-02 DIAGNOSIS — D2261 Melanocytic nevi of right upper limb, including shoulder: Secondary | ICD-10-CM | POA: Diagnosis not present

## 2018-02-02 DIAGNOSIS — D2262 Melanocytic nevi of left upper limb, including shoulder: Secondary | ICD-10-CM | POA: Diagnosis not present

## 2018-02-02 DIAGNOSIS — D485 Neoplasm of uncertain behavior of skin: Secondary | ICD-10-CM | POA: Diagnosis not present

## 2018-02-02 DIAGNOSIS — D2272 Melanocytic nevi of left lower limb, including hip: Secondary | ICD-10-CM | POA: Diagnosis not present

## 2018-02-02 DIAGNOSIS — D1801 Hemangioma of skin and subcutaneous tissue: Secondary | ICD-10-CM | POA: Diagnosis not present

## 2018-02-02 DIAGNOSIS — L821 Other seborrheic keratosis: Secondary | ICD-10-CM | POA: Diagnosis not present

## 2018-02-02 DIAGNOSIS — D225 Melanocytic nevi of trunk: Secondary | ICD-10-CM | POA: Diagnosis not present

## 2018-02-02 DIAGNOSIS — L718 Other rosacea: Secondary | ICD-10-CM | POA: Diagnosis not present

## 2018-02-02 MED FILL — metroNIDAZOLE 0.75 % GEL: 0.75 | 20 days supply | Qty: 45 | Fill #0

## 2018-02-22 ENCOUNTER — Other Ambulatory Visit: Payer: Self-pay | Admitting: Family Medicine

## 2018-02-22 NOTE — Telephone Encounter (Signed)
Last OV 02/17/2017   Last refilled 06/29/2017 disp 180 with no refills   Sent to PCP for approval

## 2018-02-23 MED FILL — ALLOPURINOL 100 MG TABLET: 100 | 90 days supply | Qty: 180 | Fill #0

## 2018-03-10 MED FILL — FLUTICASONE PROP 50 MCG SPR: 50 | 30 days supply | Qty: 16 | Fill #1

## 2018-03-16 ENCOUNTER — Telehealth: Payer: Self-pay | Admitting: Family Medicine

## 2018-03-16 NOTE — Telephone Encounter (Signed)
Copied from [place]. Topic: Quick Communication - See Telephone Encounter >> Mar 16, 2018  2:14 PM Vernona Rieger wrote: CRM for notification. See Telephone encounter for: 03/16/18.  Patient states that he is going to [facility] and wants to know if Dr Sarajane Jews could call him in a EPI-PEN. 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  Please advise. He would like that to go to the [employer]. He said the one he has is old and has expired.

## 2018-03-16 NOTE — Telephone Encounter (Signed)
Pt needs this filled before Monday sent to Dr. Elease Hashimoto for the [facility] to fill Epipen not on pt's current medication list sent for approval.   Last OV 02/07/2017

## 2018-03-16 NOTE — Telephone Encounter (Signed)
OK to refill

## 2018-03-17 ENCOUNTER — Other Ambulatory Visit: Payer: Self-pay | Admitting: Family Medicine

## 2018-03-17 MED ORDER — EPINEPHRINE 0.15 MG/0.15ML IJ SOAJ: 0.1500 mg | INTRAMUSCULAR | 0 refills | Status: DC | PRN

## 2018-03-17 NOTE — Telephone Encounter (Signed)
Medication change request Epinephrine IJ injection filled today  Dr Sarajane Jews   Request is from [facility] needs to discuss

## 2018-03-17 NOTE — Telephone Encounter (Signed)
Called [facility] and had the Rx canceled as requested by the pt and resent Rx to [facility] as advised to so so by pt. Pt advised and voiced understanding that this have been done.

## 2018-03-17 NOTE — Telephone Encounter (Signed)
Pharmacy called to advise the Epi pen 0.3 is on backorder. They want to know should they give (2) epi pen jr which is 0.15 (in stock) They do not know when the reg epi pen will be available. You can call or send new rx.  [place], [place] - [address] [phone] (Phone) [phone] (Fax)

## 2018-03-17 NOTE — Addendum Note (Signed)
Addended by: Myriam Forehand on: 03/17/2018 12:17 PM   Modules accepted: Orders

## 2018-03-17 NOTE — Telephone Encounter (Signed)
Copied from [place] [phone]. Topic: Quick Communication - Rx Refill/Question >> Mar 17, 2018 12:46 PM Johnny Hopkins wrote: Medication: EPINEPHrine 0.15 MG/0.15ML IJ injection [[id number]]   Pharmacy called to talk about this Rx; needs to be changed, contact pharmacy

## 2018-03-18 NOTE — Telephone Encounter (Signed)
I spoke with pharmacy at [facility] outpatient, they need a new script for Epi and directions must read inject 2 pens, this dose will equal adult dose.

## 2018-03-18 NOTE — Telephone Encounter (Signed)
Pharmacy is calling again for clarification. They called in the junior which is a kids dose.

## 2018-03-21 MED ORDER — EPINEPHRINE 0.3 MG/0.3ML IJ SOAJ: 0.3000 mg | Freq: Once | INTRAMUSCULAR | 0 refills | Status: AC

## 2018-03-21 MED FILL — EPINEPHRINE 0.3 MG AUTO-INJ: 0.3 | 30 days supply | Qty: 2 | Fill #0

## 2018-03-21 NOTE — Telephone Encounter (Signed)
Adult dose sent to the pharmacy by e-scribe to [facility].

## 2018-04-25 MED FILL — LISINOPRIL 20 MG TABLET: 20 | 90 days supply | Qty: 90 | Fill #1

## 2018-05-05 ENCOUNTER — Ambulatory Visit (INDEPENDENT_AMBULATORY_CARE_PROVIDER_SITE_OTHER): Payer: 59 | Admitting: Family Medicine

## 2018-05-05 ENCOUNTER — Encounter: Payer: Self-pay | Admitting: Family Medicine

## 2018-05-05 VITALS — BP 102/70 | HR 75 | Temp 97.8°F | Ht 71.75 in | Wt 197.4 lb

## 2018-05-05 DIAGNOSIS — Z125 Encounter for screening for malignant neoplasm of prostate: Secondary | ICD-10-CM | POA: Diagnosis not present

## 2018-05-05 DIAGNOSIS — M1000 Idiopathic gout, unspecified site: Secondary | ICD-10-CM

## 2018-05-05 DIAGNOSIS — Z0000 Encounter for general adult medical examination without abnormal findings: Secondary | ICD-10-CM | POA: Diagnosis not present

## 2018-05-05 LAB — CBC WITH DIFFERENTIAL/PLATELET
BASOS ABS: 0 10*3/uL (ref 0.0–0.1)
Basophils Relative: 0.9 % (ref 0.0–3.0)
Eosinophils Absolute: 0.3 10*3/uL (ref 0.0–0.7)
Eosinophils Relative: 7.5 % — ABNORMAL HIGH (ref 0.0–5.0)
HCT: 44.9 % (ref 39.0–52.0)
Hemoglobin: 15.7 g/dL (ref 13.0–17.0)
Lymphocytes Relative: 39.8 % (ref 12.0–46.0)
Lymphs Abs: 1.8 10*3/uL (ref 0.7–4.0)
MCHC: 35.1 g/dL (ref 30.0–36.0)
MCV: 91.7 fl (ref 78.0–100.0)
MONOS PCT: 10.4 % (ref 3.0–12.0)
Monocytes Absolute: 0.5 10*3/uL (ref 0.1–1.0)
Neutro Abs: 1.9 10*3/uL (ref 1.4–7.7)
Neutrophils Relative %: 41.4 % — ABNORMAL LOW (ref 43.0–77.0)
Platelets: 234 10*3/uL (ref 150.0–400.0)
RBC: 4.89 Mil/uL (ref 4.22–5.81)
RDW: 12.8 % (ref 11.5–15.5)
WBC: 4.6 10*3/uL (ref 4.0–10.5)

## 2018-05-05 LAB — LIPID PANEL
CHOL/HDL RATIO: 4
Cholesterol: 188 mg/dL (ref 0–200)
HDL: 42.6 mg/dL (ref >39.00)
NONHDL: 145.54
TRIGLYCERIDES: 384 mg/dL — AB (ref 0.0–149.0)
VLDL: 76.8 mg/dL — ABNORMAL HIGH (ref 0.0–40.0)

## 2018-05-05 LAB — POC URINALSYSI DIPSTICK (AUTOMATED)
Bilirubin, UA: NEGATIVE
Blood, UA: NEGATIVE
Glucose, UA: NEGATIVE
Ketones, UA: NEGATIVE
LEUKOCYTES UA: NEGATIVE
NITRITE UA: NEGATIVE
PH UA: 6 (ref 5.0–8.0)
Protein, UA: NEGATIVE
Spec Grav, UA: 1.02 (ref 1.010–1.025)
Urobilinogen, UA: 0.2 E.U./dL

## 2018-05-05 LAB — BASIC METABOLIC PANEL
BUN: 18 mg/dL (ref 6–23)
CHLORIDE: 103 meq/L (ref 96–112)
CO2: 26 mEq/L (ref 19–32)
CREATININE: 1.03 mg/dL (ref 0.40–1.50)
Calcium: 9.4 mg/dL (ref 8.4–10.5)
GFR: 80.63 mL/min (ref >60.00)
Glucose, Bld: 103 mg/dL — ABNORMAL HIGH (ref 70–99)
Potassium: 4 mEq/L (ref 3.5–5.1)
SODIUM: 138 meq/L (ref 135–145)

## 2018-05-05 LAB — LDL CHOLESTEROL, DIRECT: Direct LDL: 101 mg/dL

## 2018-05-05 LAB — HEPATIC FUNCTION PANEL
ALT: 24 U/L (ref 0–53)
AST: 18 U/L (ref 0–37)
Albumin: 4.5 g/dL (ref 3.5–5.2)
Alkaline Phosphatase: 68 U/L (ref 39–117)
BILIRUBIN TOTAL: 0.5 mg/dL (ref 0.2–1.2)
Bilirubin, Direct: 0.1 mg/dL (ref 0.0–0.3)
Total Protein: 6.6 g/dL (ref 6.0–8.3)

## 2018-05-05 LAB — PSA: PSA: 0.36 ng/mL (ref 0.10–4.00)

## 2018-05-05 LAB — URIC ACID: URIC ACID, SERUM: 5.4 mg/dL (ref 4.0–7.8)

## 2018-05-05 LAB — TSH: TSH: 2.43 u[IU]/mL (ref 0.35–4.50)

## 2018-05-05 MED ORDER — LISINOPRIL 20 MG PO TABS: 20.0000 mg | ORAL_TABLET | Freq: Every day | ORAL | 3 refills | Status: DC

## 2018-05-05 MED ORDER — MELOXICAM 15 MG PO TABS: 15.0000 mg | ORAL_TABLET | Freq: Every day | ORAL | 3 refills | Status: DC

## 2018-05-05 MED ORDER — METHOCARBAMOL 500 MG PO TABS: 500.0000 mg | ORAL_TABLET | Freq: Four times a day (QID) | ORAL | 5 refills | Status: DC | PRN

## 2018-05-05 MED ORDER — OMEPRAZOLE 40 MG PO CPDR: 40.0000 mg | DELAYED_RELEASE_CAPSULE | Freq: Every day | ORAL | 3 refills | Status: DC

## 2018-05-05 MED ORDER — ROSUVASTATIN CALCIUM 20 MG PO TABS: 20.0000 mg | ORAL_TABLET | Freq: Every day | ORAL | 3 refills | Status: DC

## 2018-05-05 MED ORDER — ALLOPURINOL 100 MG PO TABS: 200.0000 mg | ORAL_TABLET | Freq: Every day | ORAL | 3 refills | Status: DC

## 2018-05-05 MED ORDER — OMEGA-3-ACID ETHYL ESTERS 1 G PO CAPS: ORAL_CAPSULE | ORAL | 3 refills | Status: DC

## 2018-05-05 MED ORDER — FENOFIBRATE 145 MG PO TABS
145.0000 mg | ORAL_TABLET | Freq: Every day | ORAL | 3 refills | Status: DC
Start: 2018-05-05 — End: 2019-11-15

## 2018-05-05 NOTE — Progress Notes (Signed)
   Subjective:    Patient ID: Johnny Hopkins, male    DOB: 1966-09-06, 52 y.o.   MRN: [id number]  HPI Here for a well exam. He feels great.    Review of Systems  Constitutional: Negative.   HENT: Negative.   Eyes: Negative.   Respiratory: Negative.   Cardiovascular: Negative.   Gastrointestinal: Negative.   Genitourinary: Negative.   Musculoskeletal: Negative.   Skin: Negative.   Neurological: Negative.   Psychiatric/Behavioral: Negative.        Objective:   Physical Exam  Constitutional: He is oriented to person, place, and time. He appears well-developed and well-nourished. No distress.  HENT:  Head: Normocephalic and atraumatic.  Right Ear: External ear normal.  Left Ear: External ear normal.  Nose: Nose normal.  Mouth/Throat: Oropharynx is clear and moist. No oropharyngeal exudate.  Eyes: Pupils are equal, round, and reactive to light. Conjunctivae and EOM are normal. Right eye exhibits no discharge. Left eye exhibits no discharge. No scleral icterus.  Neck: Neck supple. No JVD present. No tracheal deviation present. No thyromegaly present.  Cardiovascular: Normal rate, regular rhythm, normal heart sounds and intact distal pulses. Exam reveals no gallop and no friction rub.  No murmur heard. Pulmonary/Chest: Effort normal and breath sounds normal. No respiratory distress. He has no wheezes. He has no rales. He exhibits no tenderness.  Abdominal: Soft. Bowel sounds are normal. He exhibits no distension and no mass. There is no tenderness. There is no rebound and no guarding.  Genitourinary: Rectum normal, prostate normal and penis normal. Rectal exam shows guaiac negative stool. No penile tenderness.  Musculoskeletal: Normal range of motion. He exhibits no edema or tenderness.  Lymphadenopathy:    He has no cervical adenopathy.  Neurological: He is alert and oriented to person, place, and time. He has normal reflexes. He displays normal reflexes. No cranial nerve deficit. He  exhibits normal muscle tone. Coordination normal.  Skin: Skin is warm and dry. No rash noted. He is not diaphoretic. No erythema. No pallor.  Psychiatric: He has a normal mood and affect. His behavior is normal. Judgment and thought content normal.          Assessment & Plan:  Well exam. We discussed diet and exercise. Get fasting labs.  Alysia Penna, MD

## 2018-06-24 MED FILL — ALLOPURINOL 100 MG TABLET: 100 | 90 days supply | Qty: 180 | Fill #1

## 2018-06-24 MED FILL — FLUTICASONE PROP 50 MCG SPR: 50 | 30 days supply | Qty: 16 | Fill #2

## 2018-06-24 MED FILL — ROSUVASTATIN CALCIUM 20 MG: 20 | 90 days supply | Qty: 90 | Fill #0

## 2018-06-24 MED FILL — MELOXICAM 15 MG TABLET: 15 | 90 days supply | Qty: 90 | Fill #1

## 2018-06-24 MED FILL — FENOFIBRATE 145 MG TAB: 145 | 90 days supply | Qty: 90 | Fill #1

## 2018-07-04 DIAGNOSIS — H5213 Myopia, bilateral: Secondary | ICD-10-CM | POA: Diagnosis not present

## 2018-07-04 DIAGNOSIS — H524 Presbyopia: Secondary | ICD-10-CM | POA: Diagnosis not present

## 2018-09-05 ENCOUNTER — Other Ambulatory Visit: Payer: Self-pay | Admitting: Family Medicine

## 2018-09-05 MED FILL — FLUTICASONE PROP 50 MCG SPR: 50 | 30 days supply | Qty: 16 | Fill #0

## 2018-09-05 MED FILL — LISINOPRIL 20 MG TABLET: 20 | 90 days supply | Qty: 90 | Fill #2

## 2018-11-24 MED FILL — ALLOPURINOL 100 MG TABS: 100 | 90 days supply | Qty: 180 | Fill #2

## 2018-11-24 MED FILL — ROSUVASTATIN CALCIUM 20 MG: 20 | 90 days supply | Qty: 90 | Fill #1

## 2018-12-27 ENCOUNTER — Other Ambulatory Visit: Payer: Self-pay | Admitting: Family Medicine

## 2018-12-27 MED FILL — FENOFIBRATE 145 MG TABLET: 145 | 90 days supply | Qty: 90 | Fill #2

## 2018-12-28 MED FILL — MELOXICAM 15 MG TABLET: 15 | 90 days supply | Qty: 90 | Fill #0

## 2018-12-28 MED FILL — LISINOPRIL 20 MG TABLET: 20 | 90 days supply | Qty: 90 | Fill #0

## 2019-04-11 MED FILL — LISINOPRIL 20 MG TABLET: 20 | 90 days supply | Qty: 90 | Fill #0

## 2019-04-11 MED FILL — ROSUVASTATIN CALCIUM 20 MG: 20 | 90 days supply | Qty: 90 | Fill #0

## 2019-04-11 MED FILL — FENOFIBRATE 145 MG TABS: 145 | 90 days supply | Qty: 90 | Fill #0

## 2019-04-11 MED FILL — ALLOPURINOL 100 MG TABS: 100 | 90 days supply | Qty: 180 | Fill #0

## 2019-04-11 MED FILL — MELOXICAM 15 MG TABLET: 15 | 90 days supply | Qty: 90 | Fill #0

## 2019-04-11 MED FILL — METHOCARBAMOL 500 MG TABLET: 500 | 30 days supply | Qty: 120 | Fill #0

## 2019-04-15 MED FILL — FLUTICASONE PROP 50 MCG SPR: 50 | 30 days supply | Qty: 16 | Fill #0

## 2019-09-06 ENCOUNTER — Other Ambulatory Visit: Payer: Self-pay | Admitting: Family Medicine

## 2019-09-07 MED FILL — ALLOPURINOL 100 MG TABS: 100 | 90 days supply | Qty: 180 | Fill #0

## 2019-09-07 MED FILL — LISINOPRIL 20 MG TABLET: 20 | 90 days supply | Qty: 90 | Fill #0

## 2019-11-09 ENCOUNTER — Other Ambulatory Visit: Payer: Self-pay | Admitting: Family Medicine

## 2019-11-15 ENCOUNTER — Ambulatory Visit (INDEPENDENT_AMBULATORY_CARE_PROVIDER_SITE_OTHER): Payer: 59 | Admitting: Family Medicine

## 2019-11-15 ENCOUNTER — Other Ambulatory Visit: Payer: Self-pay

## 2019-11-15 ENCOUNTER — Encounter: Payer: Self-pay | Admitting: Family Medicine

## 2019-11-15 VITALS — BP 120/80 | HR 73 | Temp 98.1°F | Wt 205.0 lb

## 2019-11-15 DIAGNOSIS — Z0000 Encounter for general adult medical examination without abnormal findings: Secondary | ICD-10-CM | POA: Diagnosis not present

## 2019-11-15 DIAGNOSIS — Z23 Encounter for immunization: Secondary | ICD-10-CM | POA: Diagnosis not present

## 2019-11-15 LAB — HEPATIC FUNCTION PANEL
ALT: 36 U/L (ref 0–53)
AST: 27 U/L (ref 0–37)
Albumin: 4.6 g/dL (ref 3.5–5.2)
Alkaline Phosphatase: 61 U/L (ref 39–117)
Bilirubin, Direct: 0.1 mg/dL (ref 0.0–0.3)
Total Bilirubin: 0.7 mg/dL (ref 0.2–1.2)
Total Protein: 6.8 g/dL (ref 6.0–8.3)

## 2019-11-15 LAB — CBC WITH DIFFERENTIAL/PLATELET
Basophils Absolute: 0 10*3/uL (ref 0.0–0.1)
Basophils Relative: 0.9 % (ref 0.0–3.0)
Eosinophils Absolute: 0.3 10*3/uL (ref 0.0–0.7)
Eosinophils Relative: 6.2 % — ABNORMAL HIGH (ref 0.0–5.0)
HCT: 44.9 % (ref 39.0–52.0)
Hemoglobin: 15.4 g/dL (ref 13.0–17.0)
Lymphocytes Relative: 32 % (ref 12.0–46.0)
Lymphs Abs: 1.6 10*3/uL (ref 0.7–4.0)
MCHC: 34.4 g/dL (ref 30.0–36.0)
MCV: 92.8 fl (ref 78.0–100.0)
Monocytes Absolute: 0.5 10*3/uL (ref 0.1–1.0)
Monocytes Relative: 10.8 % (ref 3.0–12.0)
Neutro Abs: 2.5 10*3/uL (ref 1.4–7.7)
Neutrophils Relative %: 50.1 % (ref 43.0–77.0)
Platelets: 222 10*3/uL (ref 150.0–400.0)
RBC: 4.83 Mil/uL (ref 4.22–5.81)
RDW: 12.6 % (ref 11.5–15.5)
WBC: 4.9 10*3/uL (ref 4.0–10.5)

## 2019-11-15 LAB — LIPID PANEL
Cholesterol: 241 mg/dL — ABNORMAL HIGH (ref 0–200)
HDL: 42.8 mg/dL (ref >39.00)
LDL Cholesterol: 163 mg/dL — ABNORMAL HIGH (ref 0–99)
NonHDL: 197.98
Total CHOL/HDL Ratio: 6
Triglycerides: 176 mg/dL — ABNORMAL HIGH (ref 0.0–149.0)
VLDL: 35.2 mg/dL (ref 0.0–40.0)

## 2019-11-15 LAB — PSA: PSA: 0.33 ng/mL (ref 0.10–4.00)

## 2019-11-15 LAB — BASIC METABOLIC PANEL
BUN: 18 mg/dL (ref 6–23)
CO2: 24 mEq/L (ref 19–32)
Calcium: 9.4 mg/dL (ref 8.4–10.5)
Chloride: 107 mEq/L (ref 96–112)
Creatinine, Ser: 0.83 mg/dL (ref 0.40–1.50)
GFR: 96.75 mL/min (ref >60.00)
Glucose, Bld: 102 mg/dL — ABNORMAL HIGH (ref 70–99)
Potassium: 4.1 mEq/L (ref 3.5–5.1)
Sodium: 139 mEq/L (ref 135–145)

## 2019-11-15 LAB — TSH: TSH: 1.95 u[IU]/mL (ref 0.35–4.50)

## 2019-11-15 MED ORDER — FENOFIBRATE 145 MG PO TABS: 145.0000 mg | ORAL_TABLET | Freq: Every day | ORAL | 3 refills | Status: DC

## 2019-11-15 MED ORDER — MELOXICAM 15 MG PO TABS: 15.0000 mg | ORAL_TABLET | Freq: Every day | ORAL | 3 refills | Status: DC

## 2019-11-15 MED ORDER — OMEPRAZOLE 40 MG PO CPDR: 40.0000 mg | DELAYED_RELEASE_CAPSULE | Freq: Every day | ORAL | 3 refills | Status: DC

## 2019-11-15 MED ORDER — FLUTICASONE PROPIONATE 50 MCG/ACT NA SUSP: 2.0000 | Freq: Every day | NASAL | 3 refills | Status: DC

## 2019-11-15 MED ORDER — ROSUVASTATIN CALCIUM 20 MG PO TABS: 20.0000 mg | ORAL_TABLET | Freq: Every day | ORAL | 3 refills | Status: DC

## 2019-11-15 MED FILL — ROSUVASTATIN CALCIUM 20 MG: 20 | 90 days supply | Qty: 90 | Fill #0

## 2019-11-15 MED FILL — MELOXICAM 15 MG TABLET: 15 | 90 days supply | Qty: 90 | Fill #0

## 2019-11-15 MED FILL — FLUTICASONE PROP 50 MCG SPR: 50 | 90 days supply | Qty: 48 | Fill #0

## 2019-11-15 MED FILL — FENOFIBRATE 145 MG TABS: 145 | 90 days supply | Qty: 90 | Fill #0

## 2019-11-15 MED FILL — OMEPRAZOLE 40 MG CPDR: 40 | 90 days supply | Qty: 90 | Fill #0

## 2019-11-15 NOTE — Progress Notes (Signed)
   Subjective:    Patient ID: Johnny Hopkins, male    DOB: 09/29/1966, 53 y.o.   MRN: [id number]  HPI Here for a well exam. He feels great.    Review of Systems  Constitutional: Negative.   HENT: Negative.   Eyes: Negative.   Respiratory: Negative.   Cardiovascular: Negative.   Gastrointestinal: Negative.   Genitourinary: Negative.   Musculoskeletal: Negative.   Skin: Negative.   Neurological: Negative.   Psychiatric/Behavioral: Negative.        Objective:   Physical Exam Constitutional:      General: He is not in acute distress.    Appearance: He is well-developed. He is not diaphoretic.  HENT:     Head: Normocephalic and atraumatic.     Right Ear: External ear normal.     Left Ear: External ear normal.     Nose: Nose normal.     Mouth/Throat:     Pharynx: No oropharyngeal exudate.  Eyes:     General: No scleral icterus.       Right eye: No discharge.        Left eye: No discharge.     Conjunctiva/sclera: Conjunctivae normal.     Pupils: Pupils are equal, round, and reactive to light.  Neck:     Thyroid: No thyromegaly.     Vascular: No JVD.     Trachea: No tracheal deviation.  Cardiovascular:     Rate and Rhythm: Normal rate and regular rhythm.     Heart sounds: Normal heart sounds. No murmur. No friction rub. No gallop.   Pulmonary:     Effort: Pulmonary effort is normal. No respiratory distress.     Breath sounds: Normal breath sounds. No wheezing or rales.  Chest:     Chest wall: No tenderness.  Abdominal:     General: Bowel sounds are normal. There is no distension.     Palpations: Abdomen is soft. There is no mass.     Tenderness: There is no abdominal tenderness. There is no guarding or rebound.  Genitourinary:    Penis: Normal. No tenderness.      Testes: Normal.     Prostate: Normal.     Rectum: Normal. Guaiac result negative.  Musculoskeletal:        General: No tenderness. Normal range of motion.     Cervical back: Neck supple.    Lymphadenopathy:     Cervical: No cervical adenopathy.  Skin:    General: Skin is warm and dry.     Coloration: Skin is not pale.     Findings: No erythema or rash.  Neurological:     Mental Status: He is alert and oriented to person, place, and time.     Cranial Nerves: No cranial nerve deficit.     Motor: No abnormal muscle tone.     Coordination: Coordination normal.     Deep Tendon Reflexes: Reflexes are normal and symmetric. Reflexes normal.  Psychiatric:        Behavior: Behavior normal.        Thought Content: Thought content normal.        Judgment: Judgment normal.           Assessment & Plan:  Well exam. We discussed diet and exercise. Get fasting labs. Alysia Penna, MD

## 2019-11-21 ENCOUNTER — Other Ambulatory Visit: Payer: Self-pay | Admitting: *Deleted

## 2019-11-21 DIAGNOSIS — E785 Hyperlipidemia, unspecified: Secondary | ICD-10-CM

## 2019-11-21 MED ORDER — ROSUVASTATIN CALCIUM 40 MG PO TABS: 40.0000 mg | ORAL_TABLET | Freq: Every day | ORAL | 3 refills | Status: DC

## 2019-11-21 MED FILL — ROSUVASTATIN CALCIUM 40 MG: 40 | 90 days supply | Qty: 90 | Fill #0

## 2019-12-18 ENCOUNTER — Ambulatory Visit: Payer: 59 | Attending: Internal Medicine

## 2019-12-18 DIAGNOSIS — Z23 Encounter for immunization: Secondary | ICD-10-CM | POA: Insufficient documentation

## 2019-12-18 NOTE — Progress Notes (Signed)
   Covid-19 Vaccination Clinic  Name:  Johnny Hopkins    MRN: [id number] DOB: 10/23/1966  12/18/2019  Johnny Hopkins was observed post Covid-19 immunization for 15 minutes without incidence. He was provided with Vaccine Information Sheet and instruction to access the V-Safe system.   Johnny Hopkins was instructed to call 911 with any severe reactions post vaccine: Marland Kitchen Difficulty breathing  . Swelling of your face and throat  . A fast heartbeat  . A bad rash all over your body  . Dizziness and weakness    Immunizations Administered    Name Date Dose VIS Date Route   Pfizer COVID-19 Vaccine 12/18/2019  6:28 PM 0.3 mL 11/03/2019 Intramuscular   Manufacturer: [place]   Lot: [id number]   [place]: [id number]

## 2019-12-25 MED FILL — ALLOPURINOL 100 MG TABS: 100 | 90 days supply | Qty: 180 | Fill #1

## 2019-12-25 MED FILL — LISINOPRIL 20 MG TABLET: 20 | 90 days supply | Qty: 90 | Fill #1

## 2019-12-25 MED FILL — ROSUVASTATIN CALCIUM 40 MG: 40 | 90 days supply | Qty: 90 | Fill #0

## 2019-12-25 MED FILL — MELOXICAM 15 MG TABLET: 15 | 90 days supply | Qty: 90 | Fill #0

## 2020-01-08 ENCOUNTER — Ambulatory Visit: Payer: 59

## 2020-01-09 ENCOUNTER — Ambulatory Visit: Payer: 59

## 2020-01-12 ENCOUNTER — Ambulatory Visit: Payer: 59

## 2020-01-14 ENCOUNTER — Ambulatory Visit: Payer: 59 | Attending: Internal Medicine

## 2020-01-14 DIAGNOSIS — Z23 Encounter for immunization: Secondary | ICD-10-CM | POA: Insufficient documentation

## 2020-01-14 NOTE — Progress Notes (Signed)
   Covid-19 Vaccination Clinic  Name:  Johnny Hopkins    MRN: [id number] DOB: September 24, 1966  01/14/2020  [name] was observed post Covid-19 immunization for 15 minutes without incidence. He was provided with Vaccine Information Sheet and instruction to access the V-Safe system.   [name] was instructed to call 911 with any severe reactions post vaccine: Marland Kitchen Difficulty breathing  . Swelling of your face and throat  . A fast heartbeat  . A bad rash all over your body  . Dizziness and weakness    Immunizations Administered    Name Date Dose VIS Date Route   Pfizer COVID-19 Vaccine 01/14/2020  9:13 AM 0.3 mL 11/03/2019 Intramuscular   Manufacturer: [place]   Lot: [id number]   [place]: [id number]

## 2020-02-19 ENCOUNTER — Other Ambulatory Visit: Payer: Self-pay

## 2020-03-05 ENCOUNTER — Other Ambulatory Visit: Payer: Self-pay

## 2020-03-18 ENCOUNTER — Other Ambulatory Visit: Payer: Self-pay

## 2020-03-19 ENCOUNTER — Other Ambulatory Visit (INDEPENDENT_AMBULATORY_CARE_PROVIDER_SITE_OTHER): Payer: 59

## 2020-03-19 DIAGNOSIS — E785 Hyperlipidemia, unspecified: Secondary | ICD-10-CM

## 2020-03-19 LAB — LIPID PANEL
Cholesterol: 157 mg/dL (ref 0–200)
HDL: 41.2 mg/dL (ref >39.00)
LDL Cholesterol: 79 mg/dL (ref 0–99)
NonHDL: 115.71
Total CHOL/HDL Ratio: 4
Triglycerides: 182 mg/dL — ABNORMAL HIGH (ref 0.0–149.0)
VLDL: 36.4 mg/dL (ref 0.0–40.0)

## 2020-04-18 MED FILL — LISINOPRIL 20 MG TABLET: 20 | 90 days supply | Qty: 90 | Fill #2

## 2020-07-23 ENCOUNTER — Other Ambulatory Visit: Payer: Self-pay | Admitting: Family Medicine

## 2020-07-23 MED FILL — FENOFIBRATE 145 MG TABS: 145 | 90 days supply | Qty: 90 | Fill #1

## 2020-07-23 MED FILL — MELOXICAM 15 MG TABLET: 15 | 90 days supply | Qty: 90 | Fill #1

## 2020-07-23 MED FILL — ROSUVASTATIN CALCIUM 40 MG: 40 | 90 days supply | Qty: 90 | Fill #1

## 2020-07-23 MED FILL — ALLOPURINOL 100 MG TABS: 100 | 90 days supply | Qty: 180 | Fill #2

## 2020-07-23 MED FILL — LISINOPRIL 20 MG TABLET: 20 | 90 days supply | Qty: 90 | Fill #0

## 2020-11-18 ENCOUNTER — Other Ambulatory Visit (HOSPITAL_COMMUNITY): Payer: Self-pay

## 2020-11-18 MED FILL — AMOXICILLIN 875 MG TABS: 875 | 10 days supply | Qty: 20 | Fill #0

## 2020-11-18 MED FILL — IBUPROFEN 800 MG TAB: 800 | 4 days supply | Qty: 16 | Fill #0

## 2020-11-22 MED FILL — LISINOPRIL 20 MG TABS: 20 | 90 days supply | Qty: 90 | Fill #1

## 2021-01-01 ENCOUNTER — Other Ambulatory Visit: Payer: Self-pay | Admitting: Family Medicine

## 2021-01-06 ENCOUNTER — Telehealth: Payer: 59 | Admitting: Family Medicine

## 2021-01-07 ENCOUNTER — Other Ambulatory Visit: Payer: Self-pay | Admitting: Family Medicine

## 2021-01-07 ENCOUNTER — Encounter: Payer: Self-pay | Admitting: Family Medicine

## 2021-01-07 ENCOUNTER — Telehealth (INDEPENDENT_AMBULATORY_CARE_PROVIDER_SITE_OTHER): Payer: 59 | Admitting: Family Medicine

## 2021-01-07 VITALS — Ht 71.5 in | Wt 205.0 lb

## 2021-01-07 DIAGNOSIS — J3089 Other allergic rhinitis: Secondary | ICD-10-CM | POA: Diagnosis not present

## 2021-01-07 DIAGNOSIS — K219 Gastro-esophageal reflux disease without esophagitis: Secondary | ICD-10-CM | POA: Diagnosis not present

## 2021-01-07 DIAGNOSIS — I10 Essential (primary) hypertension: Secondary | ICD-10-CM | POA: Diagnosis not present

## 2021-01-07 DIAGNOSIS — E785 Hyperlipidemia, unspecified: Secondary | ICD-10-CM | POA: Diagnosis not present

## 2021-01-07 DIAGNOSIS — M1000 Idiopathic gout, unspecified site: Secondary | ICD-10-CM

## 2021-01-07 MED ORDER — ROSUVASTATIN CALCIUM 40 MG PO TABS: 40.0000 mg | ORAL_TABLET | Freq: Every day | ORAL | 3 refills | Status: DC

## 2021-01-07 MED ORDER — ALLOPURINOL 100 MG PO TABS
200.0000 mg | ORAL_TABLET | Freq: Every day | ORAL | 3 refills | Status: DC
Start: 2021-01-07 — End: 2021-01-07

## 2021-01-07 MED ORDER — METHOCARBAMOL 500 MG PO TABS
500.0000 mg | ORAL_TABLET | Freq: Four times a day (QID) | ORAL | 5 refills | Status: DC | PRN
Start: 2021-01-07 — End: 2021-01-07

## 2021-01-07 MED ORDER — OMEGA-3-ACID ETHYL ESTERS 1 G PO CAPS: ORAL_CAPSULE | ORAL | 3 refills | Status: DC

## 2021-01-07 MED ORDER — LISINOPRIL 20 MG PO TABS
20.0000 mg | ORAL_TABLET | Freq: Every day | ORAL | 3 refills | Status: DC
Start: 2021-01-07 — End: 2021-01-07

## 2021-01-07 MED ORDER — FLUTICASONE PROPIONATE 50 MCG/ACT NA SUSP
2.0000 | Freq: Every day | NASAL | 3 refills | Status: DC
Start: 2021-01-07 — End: 2021-01-07

## 2021-01-07 MED ORDER — FENOFIBRATE 145 MG PO TABS
145.0000 mg | ORAL_TABLET | Freq: Every day | ORAL | 3 refills | Status: DC
Start: 2021-01-07 — End: 2021-01-07

## 2021-01-07 MED ORDER — OMEPRAZOLE 40 MG PO CPDR
40.0000 mg | DELAYED_RELEASE_CAPSULE | Freq: Every day | ORAL | 3 refills | Status: DC
Start: 2021-01-07 — End: 2021-01-07

## 2021-01-07 MED ORDER — MELOXICAM 15 MG PO TABS
15.0000 mg | ORAL_TABLET | Freq: Every day | ORAL | 3 refills | Status: DC
Start: 2021-01-07 — End: 2021-01-07

## 2021-01-07 MED FILL — ALLOPURINOL 100 MG TABS: 100 | 90 days supply | Qty: 180 | Fill #0

## 2021-01-07 MED FILL — METHOCARBAMOL 500 MG TABS: 500 | 30 days supply | Qty: 120 | Fill #0

## 2021-01-07 MED FILL — OMEPRAZOLE 40 MG CPDR: 40 | 90 days supply | Qty: 90 | Fill #0

## 2021-01-07 MED FILL — OMEGA-3-ACID ETHYL ESTERS 1: 1 | 90 days supply | Qty: 180 | Fill #0

## 2021-01-07 MED FILL — MELOXICAM 15 MG TABLET: 15 | 90 days supply | Qty: 90 | Fill #0

## 2021-01-07 MED FILL — FENOFIBRATE 145 MG TABS: 145 | 90 days supply | Qty: 90 | Fill #0

## 2021-01-07 MED FILL — ROSUVASTATIN CALCIUM 40 MG: 40 | 90 days supply | Qty: 90 | Fill #0

## 2021-01-07 MED FILL — FLUTICASONE PROP 50 MCG SPR: 50 | 90 days supply | Qty: 48 | Fill #0

## 2021-01-07 NOTE — Progress Notes (Signed)
   Subjective:    Patient ID: Johnny Hopkins, male    DOB: Jul 11, 1966, 55 y.o.   MRN: [id number]  HPI Virtual Visit via Telephone Note  I connected with the patient on 01/07/21 at  3:15 PM EST by telephone and verified that I am speaking with the correct person using two identifiers.   I discussed the limitations, risks, security and privacy concerns of performing an evaluation and management service by telephone and the availability of in person appointments. I also discussed with the patient that there may be a patient responsible charge related to this service. The patient expressed understanding and agreed to proceed.  Location patient: home Location provider: work or home office Participants present for the call: patient, provider Patient did not have a visit in the prior 7 days to address this/these issue(s).   History of Present Illness: He is asking for med refills. He feels fine. He has not checked his BP in a long time.    Observations/Objective: Patient sounds cheerful and well on the phone. I do not appreciate any SOB. Speech and thought processing are grossly intact. Patient reported vitals:  Assessment and Plan: For HTN and lipids and GERD we will refill his medications. He will purchase a home BP cuff to follow this. He will set up fasting labs sometime soon. Alysia Penna, MD   Follow Up Instructions:     [phone] 5-10 [phone] 11-20 [phone] 21-30 I did not refer this patient for an OV in the next 24 hours for this/these issue(s).  I discussed the assessment and treatment plan with the patient. The patient was provided an opportunity to ask questions and all were answered. The patient agreed with the plan and demonstrated an understanding of the instructions.   The patient was advised to call back or seek an in-person evaluation if the symptoms worsen or if the condition fails to improve as anticipated.  I provided 12 minutes of non-face-to-face time during this  encounter.   Alysia Penna, MD    Review of Systems     Objective:   Physical Exam        Assessment & Plan:

## 2021-03-14 ENCOUNTER — Other Ambulatory Visit (HOSPITAL_COMMUNITY): Payer: Self-pay

## 2021-03-14 MED FILL — Lisinopril Tab 20 MG: ORAL | 90 days supply | Qty: 90 | Fill #0 | Status: AC

## 2021-04-29 ENCOUNTER — Other Ambulatory Visit (HOSPITAL_BASED_OUTPATIENT_CLINIC_OR_DEPARTMENT_OTHER): Payer: Self-pay

## 2021-04-29 ENCOUNTER — Ambulatory Visit: Payer: 59 | Attending: Internal Medicine

## 2021-04-29 ENCOUNTER — Other Ambulatory Visit: Payer: Self-pay

## 2021-04-29 DIAGNOSIS — Z23 Encounter for immunization: Secondary | ICD-10-CM

## 2021-04-29 MED ORDER — PFIZER-BIONT COVID-19 VAC-TRIS 30 MCG/0.3ML IM SUSP
INTRAMUSCULAR | 0 refills | Status: AC
  Filled 2021-04-29: qty 0.3, 1d supply, fill #0

## 2021-04-29 NOTE — Progress Notes (Signed)
   Covid-19 Vaccination Clinic  Name:  Johnny Hopkins    MRN: [id number] DOB: 07-05-1966  04/29/2021  [name] was observed post Covid-19 immunization for 15 minutes without incident. He was provided with Vaccine Information Sheet and instruction to access the V-Safe system.   [name] was instructed to call 911 with any severe reactions post vaccine: Marland Kitchen Difficulty breathing  . Swelling of face and throat  . A fast heartbeat  . A bad rash all over body  . Dizziness and weakness   Immunizations Administered    Name Date Dose VIS Date Route   PFIZER Comrnaty([place] TOP) Covid-19 Vaccine 04/29/2021  3:35 PM 0.3 mL 10/31/2020 Intramuscular   Manufacturer: [place]   Lot: [id number]   [place]: [phone]

## 2021-05-16 ENCOUNTER — Other Ambulatory Visit: Payer: Self-pay

## 2021-05-19 ENCOUNTER — Ambulatory Visit (INDEPENDENT_AMBULATORY_CARE_PROVIDER_SITE_OTHER): Payer: 59 | Admitting: Family Medicine

## 2021-05-19 ENCOUNTER — Other Ambulatory Visit (HOSPITAL_COMMUNITY): Payer: Self-pay

## 2021-05-19 ENCOUNTER — Other Ambulatory Visit: Payer: Self-pay

## 2021-05-19 ENCOUNTER — Encounter: Payer: Self-pay | Admitting: Family Medicine

## 2021-05-19 VITALS — BP 110/78 | HR 77 | Temp 98.0°F | Ht 72.0 in | Wt 206.0 lb

## 2021-05-19 DIAGNOSIS — Z0000 Encounter for general adult medical examination without abnormal findings: Secondary | ICD-10-CM | POA: Diagnosis not present

## 2021-05-19 DIAGNOSIS — Z125 Encounter for screening for malignant neoplasm of prostate: Secondary | ICD-10-CM

## 2021-05-19 DIAGNOSIS — Z23 Encounter for immunization: Secondary | ICD-10-CM | POA: Diagnosis not present

## 2021-05-19 LAB — LDL CHOLESTEROL, DIRECT: Direct LDL: 116 mg/dL

## 2021-05-19 LAB — BASIC METABOLIC PANEL
BUN: 18 mg/dL (ref 6–23)
CO2: 27 mEq/L (ref 19–32)
Calcium: 9.4 mg/dL (ref 8.4–10.5)
Chloride: 106 mEq/L (ref 96–112)
Creatinine, Ser: 0.81 mg/dL (ref 0.40–1.50)
GFR: 99.64 mL/min (ref >60.00)
Glucose, Bld: 96 mg/dL (ref 70–99)
Potassium: 4.2 mEq/L (ref 3.5–5.1)
Sodium: 141 mEq/L (ref 135–145)

## 2021-05-19 LAB — CBC WITH DIFFERENTIAL/PLATELET
Basophils Absolute: 0 10*3/uL (ref 0.0–0.1)
Basophils Relative: 1 % (ref 0.0–3.0)
Eosinophils Absolute: 0.3 10*3/uL (ref 0.0–0.7)
Eosinophils Relative: 5.9 % — ABNORMAL HIGH (ref 0.0–5.0)
HCT: 43.7 % (ref 39.0–52.0)
Hemoglobin: 15.2 g/dL (ref 13.0–17.0)
Lymphocytes Relative: 37.7 % (ref 12.0–46.0)
Lymphs Abs: 1.7 10*3/uL (ref 0.7–4.0)
MCHC: 34.8 g/dL (ref 30.0–36.0)
MCV: 91.6 fl (ref 78.0–100.0)
Monocytes Absolute: 0.4 10*3/uL (ref 0.1–1.0)
Monocytes Relative: 9.9 % (ref 3.0–12.0)
Neutro Abs: 2 10*3/uL (ref 1.4–7.7)
Neutrophils Relative %: 45.5 % (ref 43.0–77.0)
Platelets: 202 10*3/uL (ref 150.0–400.0)
RBC: 4.77 Mil/uL (ref 4.22–5.81)
RDW: 12.5 % (ref 11.5–15.5)
WBC: 4.4 10*3/uL (ref 4.0–10.5)

## 2021-05-19 LAB — LIPID PANEL
Cholesterol: 190 mg/dL (ref 0–200)
HDL: 40.5 mg/dL (ref >39.00)
NonHDL: 149.71
Total CHOL/HDL Ratio: 5
Triglycerides: 231 mg/dL — ABNORMAL HIGH (ref 0.0–149.0)
VLDL: 46.2 mg/dL — ABNORMAL HIGH (ref 0.0–40.0)

## 2021-05-19 LAB — PSA: PSA: 0.37 ng/mL (ref 0.10–4.00)

## 2021-05-19 LAB — TSH: TSH: 1.88 u[IU]/mL (ref 0.35–4.50)

## 2021-05-19 LAB — HEPATIC FUNCTION PANEL
ALT: 30 U/L (ref 0–53)
AST: 24 U/L (ref 0–37)
Albumin: 4.6 g/dL (ref 3.5–5.2)
Alkaline Phosphatase: 62 U/L (ref 39–117)
Bilirubin, Direct: 0.1 mg/dL (ref 0.0–0.3)
Total Bilirubin: 0.8 mg/dL (ref 0.2–1.2)
Total Protein: 6.7 g/dL (ref 6.0–8.3)

## 2021-05-19 LAB — HEMOGLOBIN A1C: Hgb A1c MFr Bld: 5.1 % (ref 4.6–6.5)

## 2021-05-19 LAB — T4, FREE: Free T4: 0.55 ng/dL — ABNORMAL LOW (ref 0.60–1.60)

## 2021-05-19 LAB — T3, FREE: T3, Free: 4.8 pg/mL — ABNORMAL HIGH (ref 2.3–4.2)

## 2021-05-19 MED ORDER — DIAZEPAM 5 MG PO TABS
5.0000 mg | ORAL_TABLET | Freq: Two times a day (BID) | ORAL | 1 refills | Status: DC | PRN
  Filled 2021-05-19: qty 30, 15d supply, fill #0

## 2021-05-19 NOTE — Addendum Note (Signed)
Addended by: Elmer Picker on: 05/19/2021 09:38 AM   Modules accepted: Orders

## 2021-05-19 NOTE — Addendum Note (Signed)
Addended by: Wyvonne Lenz on: 05/19/2021 11:06 AM   Modules accepted: Orders

## 2021-05-19 NOTE — Progress Notes (Signed)
Subjective:    Patient ID: Johnny Hopkins, male    DOB: 29-Apr-1966, 55 y.o.   MRN: [id number]  HPI Here for a well exam. He feels fine except for occasional constipation. He feels bloated at times and may go 2-3 days without a BM. He drinks lots of water and gets fiber in his diet. Also he asks for help with anxiety while flying. He will be flying to [place] in 2 weeks.    Review of Systems  Constitutional: Negative.   HENT: Negative.    Eyes: Negative.   Respiratory: Negative.    Cardiovascular: Negative.   Gastrointestinal:  Positive for constipation.  Genitourinary: Negative.   Musculoskeletal: Negative.   Skin: Negative.   Neurological: Negative.   Psychiatric/Behavioral: Negative.        Objective:   Physical Exam Constitutional:      General: He is not in acute distress.    Appearance: Normal appearance. He is well-developed. He is not diaphoretic.  HENT:     Head: Normocephalic and atraumatic.     Right Ear: External ear normal.     Left Ear: External ear normal.     Nose: Nose normal.     Mouth/Throat:     Pharynx: No oropharyngeal exudate.  Eyes:     General: No scleral icterus.       Right eye: No discharge.        Left eye: No discharge.     Conjunctiva/sclera: Conjunctivae normal.     Pupils: Pupils are equal, round, and reactive to light.  Neck:     Thyroid: No thyromegaly.     Vascular: No JVD.     Trachea: No tracheal deviation.  Cardiovascular:     Rate and Rhythm: Normal rate and regular rhythm.     Heart sounds: Normal heart sounds. No murmur heard.   No friction rub. No gallop.  Pulmonary:     Effort: Pulmonary effort is normal. No respiratory distress.     Breath sounds: Normal breath sounds. No wheezing or rales.  Chest:     Chest wall: No tenderness.  Abdominal:     General: Bowel sounds are normal. There is no distension.     Palpations: Abdomen is soft. There is no mass.     Tenderness: There is no abdominal tenderness. There is no  guarding or rebound.  Genitourinary:    Penis: Normal. No tenderness.      Testes: Normal.     Prostate: Normal.     Rectum: Normal. Guaiac result negative.  Musculoskeletal:        General: No tenderness. Normal range of motion.     Cervical back: Neck supple.  Lymphadenopathy:     Cervical: No cervical adenopathy.  Skin:    General: Skin is warm and dry.     Coloration: Skin is not pale.     Findings: No erythema or rash.  Neurological:     Mental Status: He is alert and oriented to person, place, and time.     Cranial Nerves: No cranial nerve deficit.     Motor: No abnormal muscle tone.     Coordination: Coordination normal.     Deep Tendon Reflexes: Reflexes are normal and symmetric. Reflexes normal.  Psychiatric:        Behavior: Behavior normal.        Thought Content: Thought content normal.        Judgment: Judgment normal.  Assessment & Plan:  Well exam. We discussed diet and exercise. Get fasting labs. For the constipation he will try Metamucil daily. 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  Alysia Penna, MD

## 2021-07-08 ENCOUNTER — Other Ambulatory Visit (HOSPITAL_COMMUNITY): Payer: Self-pay

## 2021-07-08 MED FILL — Lisinopril Tab 20 MG: ORAL | 90 days supply | Qty: 90 | Fill #1 | Status: AC

## 2021-07-08 MED FILL — Allopurinol Tab 100 MG: ORAL | 90 days supply | Qty: 180 | Fill #0 | Status: AC

## 2021-09-01 ENCOUNTER — Other Ambulatory Visit (HOSPITAL_COMMUNITY): Payer: Self-pay

## 2021-09-01 MED FILL — Meloxicam Tab 15 MG: ORAL | 90 days supply | Qty: 90 | Fill #0 | Status: CN

## 2021-09-09 ENCOUNTER — Other Ambulatory Visit (HOSPITAL_COMMUNITY): Payer: Self-pay

## 2021-09-09 MED FILL — Meloxicam Tab 15 MG: ORAL | 90 days supply | Qty: 90 | Fill #0 | Status: AC

## 2021-09-17 ENCOUNTER — Other Ambulatory Visit (HOSPITAL_COMMUNITY): Payer: Self-pay

## 2021-09-17 MED FILL — Rosuvastatin Calcium Tab 40 MG: ORAL | 90 days supply | Qty: 90 | Fill #0 | Status: CN

## 2021-09-25 ENCOUNTER — Other Ambulatory Visit (HOSPITAL_COMMUNITY): Payer: Self-pay

## 2021-10-01 ENCOUNTER — Other Ambulatory Visit (HOSPITAL_COMMUNITY): Payer: Self-pay

## 2021-10-01 MED FILL — Lisinopril Tab 20 MG: ORAL | 90 days supply | Qty: 90 | Fill #2 | Status: CN

## 2021-10-01 MED FILL — Rosuvastatin Calcium Tab 40 MG: ORAL | 90 days supply | Qty: 90 | Fill #0 | Status: AC

## 2021-10-10 ENCOUNTER — Other Ambulatory Visit (HOSPITAL_COMMUNITY): Payer: Self-pay

## 2021-10-10 MED FILL — Fenofibrate Tab 145 MG: ORAL | 90 days supply | Qty: 90 | Fill #0 | Status: AC

## 2021-10-28 ENCOUNTER — Other Ambulatory Visit (HOSPITAL_COMMUNITY): Payer: Self-pay

## 2021-10-28 MED FILL — Lisinopril Tab 20 MG: ORAL | 90 days supply | Qty: 90 | Fill #2 | Status: AC

## 2022-01-30 ENCOUNTER — Other Ambulatory Visit (HOSPITAL_COMMUNITY): Payer: Self-pay

## 2022-01-30 ENCOUNTER — Other Ambulatory Visit: Payer: Self-pay

## 2022-01-30 ENCOUNTER — Other Ambulatory Visit: Payer: Self-pay | Admitting: Family Medicine

## 2022-01-30 DIAGNOSIS — E785 Hyperlipidemia, unspecified: Secondary | ICD-10-CM

## 2022-01-30 MED ORDER — FLUTICASONE PROPIONATE 50 MCG/ACT NA SUSP
2.0000 | Freq: Every day | NASAL | 0 refills | Status: DC
  Filled 2022-01-30 – 2022-02-11 (×2): qty 48, 90d supply, fill #0

## 2022-01-30 MED ORDER — FENOFIBRATE 145 MG PO TABS
ORAL_TABLET | Freq: Every day | ORAL | 0 refills | Status: DC
  Filled 2022-01-30 – 2022-03-20 (×2): qty 90, 90d supply, fill #0

## 2022-01-30 MED ORDER — DIAZEPAM 5 MG PO TABS
5.0000 mg | ORAL_TABLET | Freq: Two times a day (BID) | ORAL | 5 refills | Status: DC | PRN
  Filled 2022-01-30 – 2022-03-20 (×2): qty 60, 30d supply, fill #0

## 2022-01-30 MED ORDER — ALLOPURINOL 100 MG PO TABS
ORAL_TABLET | Freq: Every day | ORAL | 0 refills | Status: DC
  Filled 2022-01-30 – 2022-02-10 (×2): qty 180, 90d supply, fill #0

## 2022-02-09 ENCOUNTER — Other Ambulatory Visit (HOSPITAL_COMMUNITY): Payer: Self-pay

## 2022-02-10 ENCOUNTER — Other Ambulatory Visit (HOSPITAL_COMMUNITY): Payer: Self-pay

## 2022-02-11 ENCOUNTER — Other Ambulatory Visit: Payer: Self-pay | Admitting: Family Medicine

## 2022-02-11 ENCOUNTER — Other Ambulatory Visit (HOSPITAL_COMMUNITY): Payer: Self-pay

## 2022-02-11 MED ORDER — LISINOPRIL 20 MG PO TABS
ORAL_TABLET | Freq: Every day | ORAL | 0 refills | Status: DC
  Filled 2022-02-11: qty 90, 90d supply, fill #0

## 2022-03-20 ENCOUNTER — Other Ambulatory Visit (HOSPITAL_COMMUNITY): Payer: Self-pay

## 2022-03-20 ENCOUNTER — Telehealth: Payer: Self-pay | Admitting: Family Medicine

## 2022-03-20 ENCOUNTER — Other Ambulatory Visit: Payer: Self-pay | Admitting: Family Medicine

## 2022-03-20 DIAGNOSIS — E785 Hyperlipidemia, unspecified: Secondary | ICD-10-CM

## 2022-03-20 MED ORDER — OMEPRAZOLE 40 MG PO CPDR
DELAYED_RELEASE_CAPSULE | Freq: Every day | ORAL | 0 refills | Status: DC
  Filled 2022-03-20: qty 90, 90d supply, fill #0

## 2022-03-20 MED ORDER — DIAZEPAM 5 MG PO TABS
5.0000 mg | ORAL_TABLET | Freq: Two times a day (BID) | ORAL | 5 refills | Status: DC | PRN
  Filled 2022-03-20: qty 60, 30d supply, fill #0

## 2022-03-20 MED ORDER — ROSUVASTATIN CALCIUM 40 MG PO TABS
ORAL_TABLET | Freq: Every day | ORAL | 0 refills | Status: DC
  Filled 2022-03-20: qty 90, 90d supply, fill #0

## 2022-03-20 NOTE — Telephone Encounter (Signed)
I sent the Diazepam to [facility]. Please cancel the RX at [facility] ?

## 2022-03-20 NOTE — Telephone Encounter (Signed)
[facility] is calling and diazepam can not be transferred from [facility] and pt is using  outpatient pharm now ?[facility] Phone:  [phone]  ?Fax:  [phone]  ?  ? ?

## 2022-03-21 ENCOUNTER — Other Ambulatory Visit (HOSPITAL_COMMUNITY): Payer: Self-pay

## 2022-03-23 ENCOUNTER — Other Ambulatory Visit (HOSPITAL_COMMUNITY): Payer: Self-pay

## 2022-03-23 NOTE — Telephone Encounter (Signed)
[facility].   Diazepam '5mg'$  has been cancelled.  ?

## 2022-04-06 ENCOUNTER — Other Ambulatory Visit: Payer: Self-pay | Admitting: Family Medicine

## 2022-04-06 ENCOUNTER — Other Ambulatory Visit (HOSPITAL_COMMUNITY): Payer: Self-pay

## 2022-04-06 MED ORDER — MELOXICAM 15 MG PO TABS
ORAL_TABLET | Freq: Every day | ORAL | 0 refills | Status: DC
  Filled 2022-04-06: qty 90, 90d supply, fill #0

## 2022-05-18 ENCOUNTER — Other Ambulatory Visit: Payer: Self-pay | Admitting: Family Medicine

## 2022-05-18 ENCOUNTER — Other Ambulatory Visit (HOSPITAL_COMMUNITY): Payer: Self-pay

## 2022-05-18 MED ORDER — LISINOPRIL 20 MG PO TABS
ORAL_TABLET | Freq: Every day | ORAL | 0 refills | Status: DC
Start: 2022-05-18 — End: 2022-07-02
  Filled 2022-05-18: qty 90, 90d supply, fill #0

## 2022-07-02 ENCOUNTER — Encounter: Payer: Self-pay | Admitting: Family Medicine

## 2022-07-02 ENCOUNTER — Other Ambulatory Visit (HOSPITAL_COMMUNITY): Payer: Self-pay

## 2022-07-02 ENCOUNTER — Ambulatory Visit (INDEPENDENT_AMBULATORY_CARE_PROVIDER_SITE_OTHER): Payer: 59 | Admitting: Family Medicine

## 2022-07-02 VITALS — BP 122/80 | HR 75 | Temp 97.8°F | Ht 74.0 in | Wt 205.0 lb

## 2022-07-02 DIAGNOSIS — Z86010 Personal history of colonic polyps: Secondary | ICD-10-CM

## 2022-07-02 DIAGNOSIS — Z0000 Encounter for general adult medical examination without abnormal findings: Secondary | ICD-10-CM

## 2022-07-02 DIAGNOSIS — E785 Hyperlipidemia, unspecified: Secondary | ICD-10-CM | POA: Diagnosis not present

## 2022-07-02 LAB — CBC WITH DIFFERENTIAL/PLATELET
Basophils Absolute: 0 10*3/uL (ref 0.0–0.1)
Basophils Relative: 0.9 % (ref 0.0–3.0)
Eosinophils Absolute: 0.2 10*3/uL (ref 0.0–0.7)
Eosinophils Relative: 4.2 % (ref 0.0–5.0)
HCT: 47.3 % (ref 39.0–52.0)
Hemoglobin: 15.9 g/dL (ref 13.0–17.0)
Lymphocytes Relative: 40.9 % (ref 12.0–46.0)
Lymphs Abs: 1.7 10*3/uL (ref 0.7–4.0)
MCHC: 33.6 g/dL (ref 30.0–36.0)
MCV: 92 fl (ref 78.0–100.0)
Monocytes Absolute: 0.5 10*3/uL (ref 0.1–1.0)
Monocytes Relative: 12.2 % — ABNORMAL HIGH (ref 3.0–12.0)
Neutro Abs: 1.7 10*3/uL (ref 1.4–7.7)
Neutrophils Relative %: 41.8 % — ABNORMAL LOW (ref 43.0–77.0)
Platelets: 226 10*3/uL (ref 150.0–400.0)
RBC: 5.14 Mil/uL (ref 4.22–5.81)
RDW: 12.8 % (ref 11.5–15.5)
WBC: 4.1 10*3/uL (ref 4.0–10.5)

## 2022-07-02 LAB — HEPATIC FUNCTION PANEL
ALT: 34 U/L (ref 0–53)
AST: 29 U/L (ref 0–37)
Albumin: 4.7 g/dL (ref 3.5–5.2)
Alkaline Phosphatase: 67 U/L (ref 39–117)
Bilirubin, Direct: 0.1 mg/dL (ref 0.0–0.3)
Total Bilirubin: 0.6 mg/dL (ref 0.2–1.2)
Total Protein: 6.9 g/dL (ref 6.0–8.3)

## 2022-07-02 LAB — LIPID PANEL
Cholesterol: 141 mg/dL (ref 0–200)
HDL: 41.3 mg/dL (ref >39.00)
LDL Cholesterol: 70 mg/dL (ref 0–99)
NonHDL: 99.91
Total CHOL/HDL Ratio: 3
Triglycerides: 150 mg/dL — ABNORMAL HIGH (ref 0.0–149.0)
VLDL: 30 mg/dL (ref 0.0–40.0)

## 2022-07-02 LAB — BASIC METABOLIC PANEL
BUN: 12 mg/dL (ref 6–23)
CO2: 27 mEq/L (ref 19–32)
Calcium: 9.3 mg/dL (ref 8.4–10.5)
Chloride: 105 mEq/L (ref 96–112)
Creatinine, Ser: 0.94 mg/dL (ref 0.40–1.50)
GFR: 90.89 mL/min (ref >60.00)
Glucose, Bld: 93 mg/dL (ref 70–99)
Potassium: 4.3 mEq/L (ref 3.5–5.1)
Sodium: 140 mEq/L (ref 135–145)

## 2022-07-02 LAB — TSH: TSH: 1.43 u[IU]/mL (ref 0.35–5.50)

## 2022-07-02 LAB — HEMOGLOBIN A1C: Hgb A1c MFr Bld: 5.4 % (ref 4.6–6.5)

## 2022-07-02 LAB — PSA: PSA: 0.36 ng/mL (ref 0.10–4.00)

## 2022-07-02 MED ORDER — FENOFIBRATE 145 MG PO TABS
145.0000 mg | ORAL_TABLET | Freq: Every day | ORAL | 3 refills | Status: DC
Start: 2022-07-02 — End: 2023-09-02
  Filled 2022-07-02: qty 90, 90d supply, fill #0
  Filled 2022-07-21: qty 27, 27d supply, fill #0
  Filled 2022-07-22: qty 63, 63d supply, fill #0
  Filled 2022-08-25: qty 90, 90d supply, fill #0
  Filled 2022-11-20: qty 90, 90d supply, fill #1
  Filled 2023-06-11: qty 90, 90d supply, fill #2

## 2022-07-02 MED ORDER — FLUTICASONE PROPIONATE 50 MCG/ACT NA SUSP
2.0000 | Freq: Every day | NASAL | 3 refills | Status: DC
  Filled 2022-07-02 – 2022-07-21 (×2): qty 48, 90d supply, fill #0
  Filled 2022-08-25 – 2022-11-20 (×2): qty 48, 90d supply, fill #1
  Filled 2023-06-11: qty 48, 90d supply, fill #2

## 2022-07-02 MED ORDER — ROSUVASTATIN CALCIUM 40 MG PO TABS
ORAL_TABLET | Freq: Every day | ORAL | 3 refills | Status: DC
  Filled 2022-07-02 – 2022-07-21 (×2): qty 90, 90d supply, fill #0
  Filled 2022-11-20: qty 90, 90d supply, fill #1
  Filled 2023-04-14: qty 90, 90d supply, fill #2

## 2022-07-02 MED ORDER — ALLOPURINOL 100 MG PO TABS
ORAL_TABLET | Freq: Every day | ORAL | 3 refills | Status: DC
  Filled 2022-07-02 – 2022-07-21 (×2): qty 180, 90d supply, fill #0
  Filled 2022-08-07: qty 60, 30d supply, fill #1
  Filled 2022-11-20: qty 180, 90d supply, fill #1
  Filled 2023-06-10: qty 180, 90d supply, fill #2

## 2022-07-02 MED ORDER — OMEPRAZOLE 40 MG PO CPDR
DELAYED_RELEASE_CAPSULE | Freq: Every day | ORAL | 3 refills | Status: DC
  Filled 2022-07-02 – 2022-07-21 (×2): qty 90, 90d supply, fill #0
  Filled 2023-04-05: qty 90, 90d supply, fill #1

## 2022-07-02 MED ORDER — MELOXICAM 15 MG PO TABS
15.0000 mg | ORAL_TABLET | Freq: Every day | ORAL | 3 refills | Status: DC
  Filled 2022-07-02 – 2022-08-25 (×3): qty 90, 90d supply, fill #0
  Filled 2022-11-20: qty 90, 90d supply, fill #1
  Filled 2023-06-10: qty 90, 90d supply, fill #2

## 2022-07-02 MED ORDER — LISINOPRIL 20 MG PO TABS
20.0000 mg | ORAL_TABLET | Freq: Every day | ORAL | 3 refills | Status: DC
  Filled 2022-07-02: qty 90, fill #0
  Filled 2022-08-25: qty 90, 90d supply, fill #0
  Filled 2022-11-20: qty 90, 90d supply, fill #1
  Filled 2023-03-21: qty 90, 90d supply, fill #2
  Filled 2023-06-10: qty 90, 90d supply, fill #3
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
## 2022-07-10 ENCOUNTER — Other Ambulatory Visit (HOSPITAL_COMMUNITY): Payer: Self-pay

## 2022-07-13 ENCOUNTER — Other Ambulatory Visit (HOSPITAL_COMMUNITY): Payer: Self-pay

## 2022-07-21 ENCOUNTER — Other Ambulatory Visit (HOSPITAL_COMMUNITY): Payer: Self-pay

## 2022-07-22 ENCOUNTER — Other Ambulatory Visit (HOSPITAL_COMMUNITY): Payer: Self-pay

## 2022-08-01 ENCOUNTER — Other Ambulatory Visit (HOSPITAL_COMMUNITY): Payer: Self-pay

## 2022-08-07 ENCOUNTER — Other Ambulatory Visit (HOSPITAL_COMMUNITY): Payer: Self-pay

## 2022-08-17 ENCOUNTER — Other Ambulatory Visit (HOSPITAL_COMMUNITY): Payer: Self-pay

## 2022-08-25 ENCOUNTER — Other Ambulatory Visit (HOSPITAL_COMMUNITY): Payer: Self-pay

## 2022-08-27 ENCOUNTER — Other Ambulatory Visit (HOSPITAL_COMMUNITY): Payer: Self-pay

## 2022-09-03 ENCOUNTER — Other Ambulatory Visit (HOSPITAL_COMMUNITY): Payer: Self-pay

## 2022-09-03 ENCOUNTER — Ambulatory Visit (AMBULATORY_SURGERY_CENTER): Payer: Self-pay

## 2022-09-03 VITALS — Ht 73.0 in | Wt 210.0 lb

## 2022-09-03 DIAGNOSIS — Z86010 Personal history of colonic polyps: Secondary | ICD-10-CM

## 2022-09-03 MED ORDER — NA SULFATE-K SULFATE-MG SULF 17.5-3.13-1.6 GM/177ML PO SOLN
1.0000 | Freq: Once | ORAL | 0 refills | Status: AC
  Filled 2022-09-03 – 2022-09-15 (×2): qty 354, 1d supply, fill #0

## 2022-09-03 NOTE — Progress Notes (Signed)
No egg or soy allergy known to patient  No issues known to pt with past sedation with any surgeries or procedures Patient denies ever being told they had issues or difficulty with intubation  No FH of Malignant Hyperthermia Pt is not on diet pills Pt is not on home 02  Pt is not on blood thinners  Pt denies issues with constipation  No A fib or A flutter Have any cardiac testing pending--NO Pt instructed to use [email or web address] or GoodRx for a price reduction on prep  Insurance verified during PV appt=

## 2022-09-08 ENCOUNTER — Encounter: Payer: Self-pay | Admitting: Internal Medicine

## 2022-09-14 ENCOUNTER — Other Ambulatory Visit (HOSPITAL_COMMUNITY): Payer: Self-pay

## 2022-09-15 ENCOUNTER — Other Ambulatory Visit (HOSPITAL_COMMUNITY): Payer: Self-pay

## 2022-09-16 ENCOUNTER — Encounter: Payer: Self-pay | Admitting: Internal Medicine

## 2022-09-16 ENCOUNTER — Ambulatory Visit (AMBULATORY_SURGERY_CENTER): Payer: 59 | Admitting: Internal Medicine

## 2022-09-16 VITALS — BP 109/76 | HR 51 | Temp 97.5°F | Resp 15 | Ht 74.0 in | Wt 210.0 lb

## 2022-09-16 DIAGNOSIS — Z09 Encounter for follow-up examination after completed treatment for conditions other than malignant neoplasm: Secondary | ICD-10-CM

## 2022-09-16 DIAGNOSIS — E785 Hyperlipidemia, unspecified: Secondary | ICD-10-CM | POA: Diagnosis not present

## 2022-09-16 DIAGNOSIS — Z1211 Encounter for screening for malignant neoplasm of colon: Secondary | ICD-10-CM | POA: Diagnosis not present

## 2022-09-16 DIAGNOSIS — Z86010 Personal history of colonic polyps: Secondary | ICD-10-CM

## 2022-09-16 DIAGNOSIS — I10 Essential (primary) hypertension: Secondary | ICD-10-CM | POA: Diagnosis not present

## 2022-09-16 HISTORY — PX: COLONOSCOPY: SHX174

## 2022-09-16 MED ORDER — SODIUM CHLORIDE 0.9 % IV SOLN: 500.0000 mL | Freq: Once | INTRAVENOUS | Status: DC

## 2022-09-16 NOTE — Progress Notes (Signed)
Vitals-DT  Pt's states no medical or surgical changes since previsit or office visit.  

## 2022-09-16 NOTE — Patient Instructions (Signed)
Discharge instructions given. Handout on Diverticulosis. Resume previous medications. YOU HAD AN ENDOSCOPIC PROCEDURE TODAY AT THE  ENDOSCOPY CENTER:   Refer to the procedure report that was given to you for any specific questions about what was found during the examination.  If the procedure report does not answer your questions, please call your gastroenterologist to clarify.  If you requested that your care partner not be given the details of your procedure findings, then the procedure report has been included in a sealed envelope for you to review at your convenience later.  YOU SHOULD EXPECT: Some feelings of bloating in the abdomen. Passage of more gas than usual.  Walking can help get rid of the air that was put into your GI tract during the procedure and reduce the bloating. If you had a lower endoscopy (such as a colonoscopy or flexible sigmoidoscopy) you may notice spotting of blood in your stool or on the toilet paper. If you underwent a bowel prep for your procedure, you may not have a normal bowel movement for a few days.  Please Note:  You might notice some irritation and congestion in your nose or some drainage.  This is from the oxygen used during your procedure.  There is no need for concern and it should clear up in a day or so.  SYMPTOMS TO REPORT IMMEDIATELY:  Following lower endoscopy (colonoscopy or flexible sigmoidoscopy):  Excessive amounts of blood in the stool  Significant tenderness or worsening of abdominal pains  Swelling of the abdomen that is new, acute  Fever of 100F or higher   For urgent or emergent issues, a gastroenterologist can be reached at any hour by calling [phone]. Do not use MyChart messaging for urgent concerns.    DIET:  We do recommend a small meal at first, but then you may proceed to your regular diet.  Drink plenty of fluids but you should avoid alcoholic beverages for 24 hours.  ACTIVITY:  You should plan to take it easy for  the rest of today and you should NOT DRIVE or use heavy machinery until tomorrow (because of the sedation medicines used during the test).    FOLLOW UP: Our staff will call the number listed on your records the next business day following your procedure.  We will call around 7:15- 8:00 am to check on you and address any questions or concerns that you may have regarding the information given to you following your procedure. If we do not reach you, we will leave a message.     If any biopsies were taken you will be contacted by phone or by letter within the next 1-3 weeks.  Please call us at [phone] if you have not heard about the biopsies in 3 weeks.    SIGNATURES/CONFIDENTIALITY: You and/or your care partner have signed paperwork which will be entered into your electronic medical record.  These signatures attest to the fact that that the information above on your After Visit Summary has been reviewed and is understood.  Full responsibility of the confidentiality of this discharge information lies with you and/or your care-partner. 

## 2022-09-16 NOTE — Progress Notes (Signed)
GASTROENTEROLOGY PROCEDURE H&P NOTE   Primary Care Physician: Laurey Morale, MD    Reason for Procedure:   Hx of adenoma of colon in 1 year  Plan:    colonoscopy  Patient is appropriate for endoscopic procedure(s) in the ambulatory ([place]) setting.  The nature of the procedure, as well as the risks, benefits, and alternatives were carefully and thoroughly reviewed with the patient. Ample time for discussion and questions allowed. The patient understood, was satisfied, and agreed to proceed.     HPI: Johnny Hopkins is a 56 y.o. male who presents for surveillance colonoscopy.  Medical history as below.  Tolerated the prep.  No recent chest pain or shortness of breath.  No abdominal pain today.  Past Medical History:  Diagnosis Date   GERD (gastroesophageal reflux disease)    on meds   Gout    Hemorrhoid    Hyperlipidemia    on meds   Hypertension    on meds   Seasonal allergies     Past Surgical History:  Procedure Laterality Date   APPENDECTOMY  1987   CLAVICLE SURGERY Right 2006   COLONOSCOPY  12/21/2016   per Dr. Hilarie Fredrickson, adenomatous polyps, repeat in 5 yrs    EXCISIONAL HEMORRHOIDECTOMY     HAND SURGERY Right 1998   compund hand fracture   UPPER GASTROINTESTINAL ENDOSCOPY  12/21/2016   normal    WISDOM TOOTH EXTRACTION      Prior to Admission medications   Medication Sig Start Date End Date Taking? Authorizing Provider  allopurinol (ZYLOPRIM) 100 MG tablet TAKE 2 TABLETS BY MOUTH DAILY 07/02/22 07/02/23 Yes Laurey Morale, MD  fenofibrate (TRICOR) 145 MG tablet Take 1 tablet (145 mg total) by mouth daily. 07/02/22  Yes Laurey Morale, MD  lisinopril (ZESTRIL) 20 MG tablet Take 1 tablet (20 mg total) by mouth daily. 07/02/22  Yes Laurey Morale, MD  meloxicam (MOBIC) 15 MG tablet Take 1 tablet (15 mg total) by mouth daily. 07/02/22  Yes Laurey Morale, MD  Na Sulfate-K Sulfate-Mg Sulf 17.5-3.13-1.6 GM/177ML SOLN Take 1 kit by mouth once for 1 dose. 09/03/22 09/16/22  Yes Yishai Rehfeld, Lajuan Lines, MD  omeprazole (PRILOSEC) 40 MG capsule TAKE 1 CAPSULE BY MOUTH ONCE A DAY Patient taking differently: Take 40 mg by mouth daily. 07/02/22 07/02/23 Yes Laurey Morale, MD  rosuvastatin (CRESTOR) 40 MG tablet TAKE 1 TABLET BY MOUTH DAILY 07/02/22 07/02/23 Yes Laurey Morale, MD  diazepam (VALIUM) 5 MG tablet Take 1 tablet (5 mg total) by mouth every 12 (twelve) hours as needed for anxiety. Patient not taking: Reported on 09/03/2022 03/20/22   Laurey Morale, MD  fluticasone [facility]) 50 MCG/ACT nasal spray Place 2 sprays into both nostrils daily. Patient taking differently: Place 2 sprays into both nostrils daily as needed. 07/02/22   Laurey Morale, MD    Current Outpatient Medications  Medication Sig Dispense Refill   allopurinol (ZYLOPRIM) 100 MG tablet TAKE 2 TABLETS BY MOUTH DAILY 180 tablet 3   fenofibrate (TRICOR) 145 MG tablet Take 1 tablet (145 mg total) by mouth daily. 90 tablet 3   lisinopril (ZESTRIL) 20 MG tablet Take 1 tablet (20 mg total) by mouth daily. 90 tablet 3   meloxicam (MOBIC) 15 MG tablet Take 1 tablet (15 mg total) by mouth daily. 90 tablet 3   Na Sulfate-K Sulfate-Mg Sulf 17.5-3.13-1.6 GM/177ML SOLN Take 1 kit by mouth once for 1 dose. 354 mL 0   omeprazole (  PRILOSEC) 40 MG capsule TAKE 1 CAPSULE BY MOUTH ONCE A DAY (Patient taking differently: Take 40 mg by mouth daily.) 90 capsule 3   rosuvastatin (CRESTOR) 40 MG tablet TAKE 1 TABLET BY MOUTH DAILY 90 tablet 3   diazepam (VALIUM) 5 MG tablet Take 1 tablet (5 mg total) by mouth every 12 (twelve) hours as needed for anxiety. (Patient not taking: Reported on 09/03/2022) 60 tablet 5   fluticasone (FLONASE) 50 MCG/ACT nasal spray Place 2 sprays into both nostrils daily. (Patient taking differently: Place 2 sprays into both nostrils daily as needed.) 48 g 3   Current Facility-Administered Medications  Medication Dose Route Frequency Provider Last Rate Last Admin   0.9 %  sodium chloride infusion  500 mL  Intravenous Once Dmarco Baldus, Lajuan Lines, MD        Allergies as of 09/16/2022 - Review Complete 09/16/2022  Allergen Reaction Noted   Peanut-containing drug products  11/28/2009   Penicillins  11/28/2009    Family History  Problem Relation Age of Onset   Heart disease Father 79       MI   Diabetes Maternal Aunt    Colon cancer Neg Hx    Colon polyps Neg Hx    Esophageal cancer Neg Hx    Stomach cancer Neg Hx    Rectal cancer Neg Hx     Social History   Socioeconomic History   Marital status: Married    Spouse name: Not on file   Number of children: 2   Years of education: Not on file   Highest education level: Not on file  Occupational History   Not on file  Tobacco Use   Smoking status: Never   Smokeless tobacco: Never  Vaping Use   Vaping Use: Never used  Substance and Sexual Activity   Alcohol use: Not Currently    Alcohol/week: 4.0 standard drinks of alcohol    Types: 4 Standard drinks or equivalent per week    Comment: 3-4  per day   Drug use: No   Sexual activity: Not on file  Other Topics Concern   Not on file  Social History Narrative   Not on file   Social Determinants of Health   Financial Resource Strain: Not on file  Food Insecurity: Not on file  Transportation Needs: Not on file  Physical Activity: Not on file  Stress: Not on file  Social Connections: Not on file  Intimate Partner Violence: Not on file    Physical Exam: Vital signs in last 24 hours: $RemoveB'@BP'DbGtDLvr$  120/79 (BP Location: Right Arm, Patient Position: Sitting, Cuff Size: Normal)   Pulse 68   Temp (!) 97.5 F (36.4 C) (Temporal)   Ht $R'6\' 2"'Ma$  (1.88 m)   Wt 210 lb (95.3 kg)   SpO2 98%   BMI 26.96 kg/m  GEN: NAD EYE: Sclerae anicteric ENT: MMM CV: Non-tachycardic Pulm: CTA b/l GI: Soft, NT/ND NEURO:  Alert & Oriented x 3   Zenovia Jarred, MD [facility]  09/16/2022 9:38 AM

## 2022-09-16 NOTE — Progress Notes (Signed)
Report to pacu rn. Vss. Care resumed by rn. 

## 2022-09-16 NOTE — Op Note (Signed)
[facility] Patient Name: Johnny Hopkins Procedure Date: 09/16/2022 9:34 AM MRN: [id number] Endoscopist: Jerene Bears , MD, [id number] Age: 56 Referring MD:  Date of Birth: 1966/04/03 Gender: Male Account #: [id number] Procedure:                Colonoscopy Indications:              High risk colon cancer surveillance: Personal                            history of non-advanced adenoma, Last colonoscopy:                            January 2018 Medicines:                Monitored Anesthesia Care Procedure:                Pre-Anesthesia Assessment:                           - Prior to the procedure, a History and Physical                            was performed, and patient medications and                            allergies were reviewed. The patient's tolerance of                            previous anesthesia was also reviewed. The risks                            and benefits of the procedure and the sedation                            options and risks were discussed with the patient.                            All questions were answered, and informed consent                            was obtained. Prior Anticoagulants: The patient has                            taken no anticoagulant or antiplatelet agents. ASA                            Grade Assessment: II - A patient with mild systemic                            disease. After reviewing the risks and benefits,                            the patient was deemed in satisfactory condition to  undergo the procedure.                           After obtaining informed consent, the colonoscope                            was passed under direct vision. Throughout the                            procedure, the patient's blood pressure, pulse, and                            oxygen saturations were monitored continuously. The                            CF [id number] #[id number] was introduced through the anus                             and advanced to the cecum, identified by                            appendiceal orifice and ileocecal valve. The                            colonoscopy was performed without difficulty. The                            patient tolerated the procedure well. The quality                            of the bowel preparation was excellent. The                            ileocecal valve, appendiceal orifice, and rectum                            were photographed. Scope In: 9:46:26 AM Scope Out: 9:56:40 AM Scope Withdrawal Time: 0 hours 8 minutes 15 seconds  Total Procedure Duration: 0 hours 10 minutes 14 seconds  Findings:                 The digital rectal exam was normal. Pertinent                            negatives include normal sphincter tone.                           A few small-mouthed diverticula were found in the                            sigmoid colon and hepatic flexure.                           The exam was otherwise without abnormality on  direct and retroflexion views. Complications:            No immediate complications. Estimated Blood Loss:     Estimated blood loss: none. Impression:               - Mild diverticulosis in the sigmoid colon and at                            the hepatic flexure.                           - The examination was otherwise normal on direct                            and retroflexion views.                           - No specimens collected. Recommendation:           - Patient has a contact number available for                            emergencies. The signs and symptoms of potential                            delayed complications were discussed with the                            patient. Return to normal activities tomorrow.                            Written discharge instructions were provided to the                            patient.                           - Resume previous diet.                            - Continue present medications.                           - Repeat colonoscopy in 10 years for surveillance. Jerene Bears, MD 09/16/2022 10:00:53 AM This report has been signed electronically.

## 2022-09-17 ENCOUNTER — Telehealth: Payer: Self-pay

## 2022-09-17 NOTE — Telephone Encounter (Signed)
Follow up call placed, VM obtained and message left. 

## 2022-11-10 ENCOUNTER — Other Ambulatory Visit (HOSPITAL_COMMUNITY): Payer: Self-pay

## 2022-11-10 MED ORDER — CHLORHEXIDINE GLUCONATE 0.12% ORAL RINSE (MEDLINE KIT)
OROMUCOSAL | 0 refills | Status: AC
  Filled 2022-11-10: qty 473, 15d supply, fill #0

## 2022-11-20 ENCOUNTER — Other Ambulatory Visit (HOSPITAL_COMMUNITY): Payer: Self-pay

## 2022-11-23 ENCOUNTER — Other Ambulatory Visit: Payer: Self-pay

## 2022-11-30 ENCOUNTER — Other Ambulatory Visit (HOSPITAL_COMMUNITY): Payer: Self-pay

## 2023-01-12 ENCOUNTER — Other Ambulatory Visit (HOSPITAL_COMMUNITY): Payer: Self-pay

## 2023-03-22 ENCOUNTER — Other Ambulatory Visit (HOSPITAL_COMMUNITY): Payer: Self-pay

## 2023-04-05 ENCOUNTER — Other Ambulatory Visit (HOSPITAL_COMMUNITY): Payer: Self-pay

## 2023-04-15 ENCOUNTER — Encounter: Payer: Self-pay | Admitting: Family Medicine

## 2023-04-15 ENCOUNTER — Telehealth (INDEPENDENT_AMBULATORY_CARE_PROVIDER_SITE_OTHER): Payer: Commercial Managed Care - PPO | Admitting: Family Medicine

## 2023-04-15 ENCOUNTER — Other Ambulatory Visit (HOSPITAL_COMMUNITY): Payer: Self-pay

## 2023-04-15 VITALS — Ht 74.0 in

## 2023-04-15 DIAGNOSIS — J0100 Acute maxillary sinusitis, unspecified: Secondary | ICD-10-CM

## 2023-04-15 MED ORDER — AZITHROMYCIN 250 MG PO TABS
ORAL_TABLET | ORAL | 0 refills | Status: AC
  Filled 2023-04-15: qty 6, 5d supply, fill #0

## 2023-04-15 MED ORDER — PSEUDOEPHEDRINE-GUAIFENESIN ER 60-600 MG PO TB12
1.0000 | ORAL_TABLET | Freq: Two times a day (BID) | ORAL | 0 refills | Status: AC
  Filled 2023-04-15: qty 20, 10d supply, fill #0

## 2023-04-15 NOTE — Progress Notes (Signed)
Established Patient Office Visit   Subjective:  Patient ID: Johnny Hopkins, male    DOB: Aug 20, 1966  Age: 57 y.o. MRN: [id number]  Chief Complaint  Patient presents with   Sinusitis    Stuffy nose, cough lots of mucus blowing nose a lot with some blood symptoms x 3 weeks.     Sinusitis Associated symptoms include congestion and coughing. Pertinent negatives include no chills.   Encounter Diagnoses  Name Primary?   Acute non-recurrent maxillary sinusitis Yes   2-3 week h/o URI symptoms with congested, sneeze, cough, copious bloody rhinorrhea. There is pressure over the maxillary areas. Has been wheezing but that has resolved.  No asthma hx. Does not smoke. No recent fevers.    Review of Systems  Constitutional: Negative.  Negative for chills and fever.  HENT:  Positive for congestion, nosebleeds and sinus pain.   Eyes:  Negative for blurred vision, discharge and redness.  Respiratory:  Positive for cough.   Cardiovascular: Negative.   Gastrointestinal:  Negative for abdominal pain.  Genitourinary: Negative.   Musculoskeletal: Negative.  Negative for joint pain and myalgias.  Skin:  Negative for rash.  Neurological:  Negative for tingling, loss of consciousness and weakness.  Endo/Heme/Allergies:  Negative for polydipsia.     Current Outpatient Medications:    allopurinol (ZYLOPRIM) 100 MG tablet, TAKE 2 TABLETS BY MOUTH DAILY, Disp: 180 tablet, Rfl: 3   azithromycin (ZITHROMAX) 250 MG tablet, Take 2 tablets on day 1, then 1 tablet daily on days 2 through 5, Disp: 6 tablet, Rfl: 0   chlorhexidine gluconate, MEDLINE KIT, (PERIDEX) 0.12 % solution, Swish 15 mls by mouth undiluted 2 times a day (AM & PM) after brushing teeth for 30 seconds, then spit. Use until gone., Disp: 473 mL, Rfl: 0   fenofibrate (TRICOR) 145 MG tablet, Take 1 tablet (145 mg total) by mouth daily., Disp: 90 tablet, Rfl: 3   fluticasone (FLONASE) 50 MCG/ACT nasal spray, Place 2 sprays into both nostrils  daily. (Patient taking differently: Place 2 sprays into both nostrils daily as needed.), Disp: 48 g, Rfl: 3   lisinopril (ZESTRIL) 20 MG tablet, Take 1 tablet (20 mg total) by mouth daily., Disp: 90 tablet, Rfl: 3   meloxicam (MOBIC) 15 MG tablet, Take 1 tablet (15 mg total) by mouth daily., Disp: 90 tablet, Rfl: 3   omeprazole (PRILOSEC) 40 MG capsule, TAKE 1 CAPSULE BY MOUTH ONCE A DAY (Patient taking differently: Take 40 mg by mouth daily.), Disp: 90 capsule, Rfl: 3   pseudoephedrine-guaifenesin (MUCINEX D) 60-600 MG 12 hr tablet, Take 1 tablet by mouth every 12 (twelve) hours., Disp: 20 tablet, Rfl: 0   rosuvastatin (CRESTOR) 40 MG tablet, TAKE 1 TABLET BY MOUTH DAILY, Disp: 90 tablet, Rfl: 3   diazepam (VALIUM) 5 MG tablet, Take 1 tablet (5 mg total) by mouth every 12 (twelve) hours as needed for anxiety. (Patient not taking: Reported on 09/03/2022), Disp: 60 tablet, Rfl: 5   Objective:     Ht 6\' 2"  (1.88 m)   BMI 26.96 kg/m    Physical Exam Constitutional:      General: He is not in acute distress.    Appearance: Normal appearance. He is not ill-appearing, toxic-appearing or diaphoretic.  HENT:     Head: Normocephalic and atraumatic.     Right Ear: External ear normal.     Left Ear: External ear normal.  Eyes:     General: No scleral icterus.  Right eye: No discharge.        Left eye: No discharge.     Extraocular Movements: Extraocular movements intact.     Conjunctiva/sclera: Conjunctivae normal.  Pulmonary:     Effort: Pulmonary effort is normal. No respiratory distress.  Skin:    General: Skin is warm and dry.  Neurological:     Mental Status: He is alert and oriented to person, place, and time.  Psychiatric:        Mood and Affect: Mood normal.        Behavior: Behavior normal.      No results found for any visits on 04/15/23.    The 10-year ASCVD risk score (Arnett DK, et al., 2019) is: 4.3%    Assessment & Plan:   Acute non-recurrent maxillary  sinusitis -     Pseudoephedrine-guaiFENesin ER; Take 1 tablet by mouth every 12 (twelve) hours.  Dispense: 20 tablet; Refill: 0 -     Azithromycin; Take 2 tablets on day 1, then 1 tablet daily on days 2 through 5  Dispense: 6 tablet; Refill: 0    Return if symptoms worsen or fail to improve.    Mliss Sax, MD  Virtual Visit via Video Note  I connected with Johnny Hopkins on 04/15/23 at  1:00 PM EDT by a video enabled telemedicine application and verified that I am speaking with the correct person using two identifiers.  Location: Patient: home alone.  Provider: work   I discussed the limitations of evaluation and management by telemedicine and the availability of in person appointments. The patient expressed understanding and agreed to proceed.  History of Present Illness:    Observations/Objective:   Assessment and Plan:   Follow Up Instructions:    I discussed the assessment and treatment plan with the patient. The patient was provided an opportunity to ask questions and all were answered. The patient agreed with the plan and demonstrated an understanding of the instructions.   The patient was advised to call back or seek an in-person evaluation if the symptoms worsen or if the condition fails to improve as anticipated.  I provided 20 minutes of non-face-to-face time during this encounter.   Mliss Sax, MD

## 2023-06-04 ENCOUNTER — Telehealth: Payer: Self-pay | Admitting: Family Medicine

## 2023-06-04 NOTE — Telephone Encounter (Signed)
ERROR PLEASE DISREGARD

## 2023-06-07 ENCOUNTER — Encounter: Payer: Self-pay | Admitting: Family Medicine

## 2023-06-07 ENCOUNTER — Ambulatory Visit: Payer: Commercial Managed Care - PPO | Admitting: Family Medicine

## 2023-06-07 VITALS — BP 118/78 | HR 77 | Temp 98.3°F | Wt 211.0 lb

## 2023-06-07 DIAGNOSIS — G5622 Lesion of ulnar nerve, left upper limb: Secondary | ICD-10-CM | POA: Diagnosis not present

## 2023-06-07 NOTE — Progress Notes (Signed)
   Subjective:    Patient ID: Johnny Hopkins, male    DOB: August 05, 1966, 57 y.o.   MRN: [id number]  HPI Here for 2 weeks of numbness and tingling in the 5 th finger of the left hand and along the ulnar side of the left forearm. No pain or weakness. He spends a fair amount of time on a computer for his work.    Review of Systems  Constitutional: Negative.   Respiratory: Negative.    Cardiovascular: Negative.   Neurological:  Positive for numbness.       Objective:   Physical Exam Constitutional:      Appearance: Normal appearance.  Cardiovascular:     Rate and Rhythm: Normal rate and regular rhythm.     Pulses: Normal pulses.     Heart sounds: Normal heart sounds.  Pulmonary:     Effort: Pulmonary effort is normal.     Breath sounds: Normal breath sounds.  Musculoskeletal:     Comments: The left forearm and hand are normal on exam. Skin is pink and warm. Full radial pulse.   Neurological:     Mental Status: He is alert.           Assessment & Plan:  Ulnar neuropathy. I advised him to arrange a support cushion for his work desk to elevated the left elbow off the surface to take pressure off it. He will let us know if this persists.  Gershon Crane, MD

## 2023-06-10 ENCOUNTER — Other Ambulatory Visit (HOSPITAL_COMMUNITY): Payer: Self-pay

## 2023-06-12 ENCOUNTER — Encounter: Payer: Self-pay | Admitting: Family Medicine

## 2023-06-14 ENCOUNTER — Other Ambulatory Visit (HOSPITAL_COMMUNITY): Payer: Self-pay

## 2023-06-14 MED ORDER — GABAPENTIN 100 MG PO CAPS
100.0000 mg | ORAL_CAPSULE | Freq: Three times a day (TID) | ORAL | 2 refills | Status: DC
  Filled 2023-06-14: qty 90, 30d supply, fill #0
  Filled 2023-07-07 – 2023-07-08 (×2): qty 90, 30d supply, fill #1
  Filled 2023-08-06: qty 90, 30d supply, fill #2

## 2023-06-14 NOTE — Telephone Encounter (Signed)
Yes it might. Call in Gabapentin 100 mg to take TID, #90 with 2 rf. We can increase the dose quite a bit if we need to

## 2023-07-07 ENCOUNTER — Other Ambulatory Visit: Payer: Self-pay | Admitting: Family Medicine

## 2023-07-07 ENCOUNTER — Other Ambulatory Visit: Payer: Self-pay

## 2023-07-07 DIAGNOSIS — E785 Hyperlipidemia, unspecified: Secondary | ICD-10-CM

## 2023-07-07 MED ORDER — ROSUVASTATIN CALCIUM 40 MG PO TABS
40.0000 mg | ORAL_TABLET | Freq: Every day | ORAL | 3 refills | Status: DC
  Filled 2023-07-07: qty 90, 90d supply, fill #0
  Filled 2024-02-22: qty 90, 90d supply, fill #1
  Filled 2024-07-02 – 2024-07-06 (×2): qty 90, 90d supply, fill #2

## 2023-07-08 ENCOUNTER — Other Ambulatory Visit: Payer: Self-pay

## 2023-09-01 ENCOUNTER — Other Ambulatory Visit: Payer: Self-pay | Admitting: Family Medicine

## 2023-09-02 ENCOUNTER — Other Ambulatory Visit: Payer: Self-pay | Admitting: Family Medicine

## 2023-09-02 ENCOUNTER — Other Ambulatory Visit: Payer: Self-pay

## 2023-09-02 DIAGNOSIS — E785 Hyperlipidemia, unspecified: Secondary | ICD-10-CM

## 2023-09-02 MED ORDER — MELOXICAM 15 MG PO TABS
15.0000 mg | ORAL_TABLET | Freq: Every day | ORAL | 3 refills | Status: DC
  Filled 2023-09-02: qty 90, 90d supply, fill #0
  Filled 2023-11-30: qty 90, 90d supply, fill #1
  Filled 2024-02-22: qty 90, 90d supply, fill #2
  Filled 2024-05-22 – 2024-07-06 (×3): qty 90, 90d supply, fill #3

## 2023-09-02 MED ORDER — FLUTICASONE PROPIONATE 50 MCG/ACT NA SUSP
2.0000 | Freq: Every day | NASAL | 3 refills | Status: DC
  Filled 2023-09-02: qty 48, 90d supply, fill #0
  Filled 2023-12-01: qty 48, 90d supply, fill #1
  Filled 2024-02-29: qty 48, 90d supply, fill #2
  Filled 2024-05-22 – 2024-07-06 (×3): qty 48, 90d supply, fill #3

## 2023-09-02 MED ORDER — ALLOPURINOL 100 MG PO TABS
200.0000 mg | ORAL_TABLET | Freq: Every day | ORAL | 3 refills | Status: DC
  Filled 2023-09-02: qty 180, 90d supply, fill #0
  Filled 2024-07-18: qty 180, 90d supply, fill #1

## 2023-09-02 MED ORDER — FENOFIBRATE 145 MG PO TABS
145.0000 mg | ORAL_TABLET | Freq: Every day | ORAL | 3 refills | Status: DC
  Filled 2023-09-02: qty 90, 90d supply, fill #0
  Filled 2023-12-01: qty 90, 90d supply, fill #1
  Filled 2024-02-29: qty 90, 90d supply, fill #2
  Filled 2024-05-22 – 2024-07-06 (×3): qty 90, 90d supply, fill #3

## 2023-09-06 ENCOUNTER — Other Ambulatory Visit: Payer: Self-pay | Admitting: Family Medicine

## 2023-09-07 ENCOUNTER — Other Ambulatory Visit (HOSPITAL_COMMUNITY): Payer: Self-pay

## 2023-09-07 MED ORDER — GABAPENTIN 100 MG PO CAPS
100.0000 mg | ORAL_CAPSULE | Freq: Three times a day (TID) | ORAL | 2 refills | Status: DC
  Filled 2023-09-07 – 2023-09-09 (×2): qty 90, 30d supply, fill #0
  Filled 2023-10-02: qty 90, 30d supply, fill #1

## 2023-09-08 ENCOUNTER — Other Ambulatory Visit: Payer: Self-pay

## 2023-09-09 ENCOUNTER — Other Ambulatory Visit: Payer: Self-pay

## 2023-09-09 ENCOUNTER — Other Ambulatory Visit (HOSPITAL_COMMUNITY): Payer: Self-pay

## 2023-09-13 ENCOUNTER — Other Ambulatory Visit (HOSPITAL_COMMUNITY): Payer: Self-pay

## 2023-09-13 ENCOUNTER — Other Ambulatory Visit: Payer: Self-pay | Admitting: Family Medicine

## 2023-09-13 ENCOUNTER — Other Ambulatory Visit: Payer: Self-pay

## 2023-09-13 MED ORDER — LISINOPRIL 20 MG PO TABS
20.0000 mg | ORAL_TABLET | Freq: Every day | ORAL | 3 refills | Status: DC
  Filled 2023-09-13: qty 90, 90d supply, fill #0
  Filled 2023-10-19 – 2023-12-11 (×2): qty 90, 90d supply, fill #1
  Filled 2024-03-06: qty 90, 90d supply, fill #2
  Filled 2024-05-24: qty 90, 90d supply, fill #3

## 2023-10-05 ENCOUNTER — Other Ambulatory Visit (HOSPITAL_COMMUNITY): Payer: Self-pay

## 2023-10-08 ENCOUNTER — Other Ambulatory Visit (HOSPITAL_COMMUNITY): Payer: Self-pay

## 2023-10-19 ENCOUNTER — Other Ambulatory Visit: Payer: Self-pay

## 2024-01-03 DIAGNOSIS — H5213 Myopia, bilateral: Secondary | ICD-10-CM | POA: Diagnosis not present

## 2024-02-22 ENCOUNTER — Other Ambulatory Visit: Payer: Self-pay

## 2024-02-22 ENCOUNTER — Other Ambulatory Visit: Payer: Self-pay | Admitting: Family Medicine

## 2024-02-22 ENCOUNTER — Other Ambulatory Visit (HOSPITAL_COMMUNITY): Payer: Self-pay

## 2024-02-24 ENCOUNTER — Other Ambulatory Visit (HOSPITAL_COMMUNITY): Payer: Self-pay

## 2024-02-24 ENCOUNTER — Other Ambulatory Visit: Payer: Self-pay

## 2024-02-24 MED ORDER — DIAZEPAM 5 MG PO TABS
5.0000 mg | ORAL_TABLET | Freq: Two times a day (BID) | ORAL | 5 refills | Status: AC | PRN
  Filled 2024-02-24: qty 60, 30d supply, fill #0
  Filled 2024-02-25 – 2024-07-06 (×2): qty 60, 30d supply, fill #1

## 2024-02-24 NOTE — Telephone Encounter (Signed)
 Pt LOV was 06/07/2023 Last refill was done on 03/20/22 Please advise

## 2024-02-25 ENCOUNTER — Other Ambulatory Visit (HOSPITAL_COMMUNITY): Payer: Self-pay

## 2024-05-22 ENCOUNTER — Encounter: Payer: Self-pay | Admitting: Pharmacist

## 2024-05-22 ENCOUNTER — Other Ambulatory Visit: Payer: Self-pay

## 2024-05-24 ENCOUNTER — Other Ambulatory Visit (HOSPITAL_COMMUNITY): Payer: Self-pay

## 2024-05-25 ENCOUNTER — Other Ambulatory Visit: Payer: Self-pay

## 2024-06-08 ENCOUNTER — Encounter: Admitting: Family Medicine

## 2024-06-14 ENCOUNTER — Encounter: Payer: Self-pay | Admitting: Family Medicine

## 2024-06-14 ENCOUNTER — Ambulatory Visit (INDEPENDENT_AMBULATORY_CARE_PROVIDER_SITE_OTHER): Admitting: Family Medicine

## 2024-06-14 ENCOUNTER — Other Ambulatory Visit (HOSPITAL_COMMUNITY): Payer: Self-pay

## 2024-06-14 VITALS — BP 126/80 | HR 73 | Temp 98.0°F | Ht 72.0 in | Wt 211.0 lb

## 2024-06-14 DIAGNOSIS — Z0000 Encounter for general adult medical examination without abnormal findings: Secondary | ICD-10-CM

## 2024-06-14 DIAGNOSIS — N401 Enlarged prostate with lower urinary tract symptoms: Secondary | ICD-10-CM | POA: Diagnosis not present

## 2024-06-14 DIAGNOSIS — J019 Acute sinusitis, unspecified: Secondary | ICD-10-CM

## 2024-06-14 DIAGNOSIS — N138 Other obstructive and reflux uropathy: Secondary | ICD-10-CM | POA: Insufficient documentation

## 2024-06-14 MED ORDER — AZITHROMYCIN 250 MG PO TABS
ORAL_TABLET | ORAL | 0 refills | Status: AC
  Filled 2024-06-14: qty 6, fill #0
  Filled 2024-06-14: qty 6, 5d supply, fill #0

## 2024-06-14 MED ORDER — TAMSULOSIN HCL 0.4 MG PO CAPS
0.4000 mg | ORAL_CAPSULE | Freq: Every day | ORAL | 3 refills | Status: DC
  Filled 2024-06-14 (×2): qty 90, 90d supply, fill #0
  Filled 2024-09-05: qty 90, 90d supply, fill #1

## 2024-06-14 NOTE — Progress Notes (Signed)
 Subjective:    Patient ID: Johnny Hopkins, male    DOB: December 18, 1965, 58 y.o.   MRN: [id number]  HPI Here for a well exam. He has 2 issues to discuss. First he has developed a slow urine stream and he has to get up several times at night to urinate. Also for the past 6 days he has had sinus pressure and is blowing green mucus from the nose. No cough or fever.    Review of Systems  Constitutional: Negative.   HENT:  Positive for congestion, postnasal drip and sinus pressure. Negative for ear pain and sore throat.   Eyes: Negative.   Respiratory: Negative.    Cardiovascular: Negative.   Gastrointestinal: Negative.   Genitourinary: Negative.   Musculoskeletal: Negative.   Skin: Negative.   Neurological: Negative.   Psychiatric/Behavioral: Negative.         Objective:   Physical Exam Constitutional:      General: He is not in acute distress.    Appearance: Normal appearance. He is well-developed. He is not diaphoretic.  HENT:     Head: Normocephalic and atraumatic.     Right Ear: External ear normal.     Left Ear: External ear normal.     Nose: Nose normal.     Mouth/Throat:     Pharynx: No oropharyngeal exudate.  Eyes:     General: No scleral icterus.       Right eye: No discharge.        Left eye: No discharge.     Conjunctiva/sclera: Conjunctivae normal.     Pupils: Pupils are equal, round, and reactive to light.  Neck:     Thyroid : No thyromegaly.     Vascular: No JVD.     Trachea: No tracheal deviation.  Cardiovascular:     Rate and Rhythm: Normal rate and regular rhythm.     Pulses: Normal pulses.     Heart sounds: Normal heart sounds. No murmur heard.    No friction rub. No gallop.  Pulmonary:     Effort: Pulmonary effort is normal. No respiratory distress.     Breath sounds: Normal breath sounds. No wheezing or rales.  Chest:     Chest wall: No tenderness.  Abdominal:     General: Bowel sounds are normal. There is no distension.     Palpations: Abdomen is  soft. There is no mass.     Tenderness: There is no abdominal tenderness. There is no guarding or rebound.  Genitourinary:    Penis: Normal. No tenderness.      Testes: Normal.     Prostate: Normal.     Rectum: Normal. Guaiac result negative.  Musculoskeletal:        General: No tenderness. Normal range of motion.     Cervical back: Neck supple.  Lymphadenopathy:     Cervical: No cervical adenopathy.  Skin:    General: Skin is warm and dry.     Coloration: Skin is not pale.     Findings: No erythema or rash.  Neurological:     General: No focal deficit present.     Mental Status: He is alert and oriented to person, place, and time.     Cranial Nerves: No cranial nerve deficit.     Motor: No abnormal muscle tone.     Coordination: Coordination normal.     Deep Tendon Reflexes: Reflexes are normal and symmetric. Reflexes normal.  Psychiatric:        Mood and Affect:  Mood normal.        Behavior: Behavior normal.        Thought Content: Thought content normal.        Judgment: Judgment normal.           Assessment & Plan:  Well exam. We discussed diet and exercise. Get fasting labs. He has a sinusitis so we will treat this with a Zpack. He also has some BPH so we will treat this with Tamsulosin  0.4 mg daily.  Garnette Olmsted, MD

## 2024-06-16 ENCOUNTER — Other Ambulatory Visit (INDEPENDENT_AMBULATORY_CARE_PROVIDER_SITE_OTHER)

## 2024-06-16 DIAGNOSIS — Z0000 Encounter for general adult medical examination without abnormal findings: Secondary | ICD-10-CM

## 2024-06-16 LAB — HEMOGLOBIN A1C: Hgb A1c MFr Bld: 5.4 % (ref 4.6–6.5)

## 2024-06-16 LAB — CBC WITH DIFFERENTIAL/PLATELET
Basophils Absolute: 0 K/uL (ref 0.0–0.1)
Basophils Relative: 0.8 % (ref 0.0–3.0)
Eosinophils Absolute: 0.3 K/uL (ref 0.0–0.7)
Eosinophils Relative: 6.2 % — ABNORMAL HIGH (ref 0.0–5.0)
HCT: 43.9 % (ref 39.0–52.0)
Hemoglobin: 15.1 g/dL (ref 13.0–17.0)
Lymphocytes Relative: 40.1 % (ref 12.0–46.0)
Lymphs Abs: 1.7 K/uL (ref 0.7–4.0)
MCHC: 34.4 g/dL (ref 30.0–36.0)
MCV: 90.6 fl (ref 78.0–100.0)
Monocytes Absolute: 0.4 K/uL (ref 0.1–1.0)
Monocytes Relative: 10 % (ref 3.0–12.0)
Neutro Abs: 1.9 K/uL (ref 1.4–7.7)
Neutrophils Relative %: 42.9 % — ABNORMAL LOW (ref 43.0–77.0)
Platelets: 205 K/uL (ref 150.0–400.0)
RBC: 4.85 Mil/uL (ref 4.22–5.81)
RDW: 12.6 % (ref 11.5–15.5)
WBC: 4.3 K/uL (ref 4.0–10.5)

## 2024-06-16 LAB — LIPID PANEL
Cholesterol: 141 mg/dL (ref 0–200)
HDL: 36.6 mg/dL — ABNORMAL LOW (ref >39.00)
LDL Cholesterol: 59 mg/dL (ref 0–99)
NonHDL: 104.52
Total CHOL/HDL Ratio: 4
Triglycerides: 227 mg/dL — ABNORMAL HIGH (ref 0.0–149.0)
VLDL: 45.4 mg/dL — ABNORMAL HIGH (ref 0.0–40.0)

## 2024-06-16 LAB — BASIC METABOLIC PANEL WITH GFR
BUN: 18 mg/dL (ref 6–23)
CO2: 25 meq/L (ref 19–32)
Calcium: 9.1 mg/dL (ref 8.4–10.5)
Chloride: 105 meq/L (ref 96–112)
Creatinine, Ser: 0.82 mg/dL (ref 0.40–1.50)
GFR: 97.15 mL/min (ref >60.00)
Glucose, Bld: 104 mg/dL — ABNORMAL HIGH (ref 70–99)
Potassium: 3.9 meq/L (ref 3.5–5.1)
Sodium: 140 meq/L (ref 135–145)

## 2024-06-16 LAB — HEPATIC FUNCTION PANEL
ALT: 29 U/L (ref 0–53)
AST: 24 U/L (ref 0–37)
Albumin: 4.4 g/dL (ref 3.5–5.2)
Alkaline Phosphatase: 63 U/L (ref 39–117)
Bilirubin, Direct: 0.2 mg/dL (ref 0.0–0.3)
Total Bilirubin: 0.8 mg/dL (ref 0.2–1.2)
Total Protein: 6.6 g/dL (ref 6.0–8.3)

## 2024-06-16 LAB — TSH: TSH: 2.07 u[IU]/mL (ref 0.35–5.50)

## 2024-06-16 LAB — PSA: PSA: 0.48 ng/mL (ref 0.10–4.00)

## 2024-06-20 ENCOUNTER — Ambulatory Visit: Payer: Self-pay | Admitting: Family Medicine

## 2024-07-03 ENCOUNTER — Other Ambulatory Visit (HOSPITAL_COMMUNITY): Payer: Self-pay

## 2024-07-03 ENCOUNTER — Other Ambulatory Visit: Payer: Self-pay

## 2024-07-05 ENCOUNTER — Other Ambulatory Visit: Payer: Self-pay

## 2024-07-06 ENCOUNTER — Other Ambulatory Visit (HOSPITAL_COMMUNITY): Payer: Self-pay

## 2024-07-18 ENCOUNTER — Other Ambulatory Visit (HOSPITAL_COMMUNITY): Payer: Self-pay

## 2024-07-19 ENCOUNTER — Other Ambulatory Visit (HOSPITAL_COMMUNITY): Payer: Self-pay

## 2024-07-19 DIAGNOSIS — L738 Other specified follicular disorders: Secondary | ICD-10-CM | POA: Diagnosis not present

## 2024-07-19 DIAGNOSIS — D1801 Hemangioma of skin and subcutaneous tissue: Secondary | ICD-10-CM | POA: Diagnosis not present

## 2024-07-19 DIAGNOSIS — L814 Other melanin hyperpigmentation: Secondary | ICD-10-CM | POA: Diagnosis not present

## 2024-07-19 DIAGNOSIS — L570 Actinic keratosis: Secondary | ICD-10-CM | POA: Diagnosis not present

## 2024-07-19 DIAGNOSIS — L821 Other seborrheic keratosis: Secondary | ICD-10-CM | POA: Diagnosis not present

## 2024-07-19 DIAGNOSIS — D225 Melanocytic nevi of trunk: Secondary | ICD-10-CM | POA: Diagnosis not present

## 2024-07-19 MED ORDER — CLINDAMYCIN PHOSPHATE 1 % EX LOTN
TOPICAL_LOTION | CUTANEOUS | 11 refills | Status: AC
  Filled 2024-07-19: qty 60, 30d supply, fill #0
  Filled 2024-08-12: qty 60, 30d supply, fill #1
  Filled 2024-09-11: qty 60, 30d supply, fill #2

## 2024-07-19 MED ORDER — FLUOROURACIL 5 % EX CREA
TOPICAL_CREAM | CUTANEOUS | 0 refills | Status: AC
  Filled 2024-07-19: qty 40, 14d supply, fill #0

## 2024-09-01 ENCOUNTER — Other Ambulatory Visit: Payer: Self-pay

## 2024-09-01 ENCOUNTER — Other Ambulatory Visit (HOSPITAL_COMMUNITY): Payer: Self-pay

## 2024-09-01 ENCOUNTER — Other Ambulatory Visit: Payer: Self-pay | Admitting: Family Medicine

## 2024-09-01 MED ORDER — LISINOPRIL 20 MG PO TABS
20.0000 mg | ORAL_TABLET | Freq: Every day | ORAL | 3 refills | Status: AC
  Filled 2024-09-01: qty 90, 90d supply, fill #0
  Filled 2024-11-25: qty 90, 90d supply, fill #1

## 2024-09-07 ENCOUNTER — Other Ambulatory Visit (HOSPITAL_COMMUNITY): Payer: Self-pay

## 2024-09-11 ENCOUNTER — Other Ambulatory Visit: Payer: Self-pay

## 2024-09-15 ENCOUNTER — Other Ambulatory Visit (HOSPITAL_COMMUNITY): Payer: Self-pay

## 2024-09-27 ENCOUNTER — Other Ambulatory Visit: Payer: Self-pay

## 2024-09-27 ENCOUNTER — Other Ambulatory Visit: Payer: Self-pay | Admitting: Family Medicine

## 2024-09-27 ENCOUNTER — Other Ambulatory Visit (HOSPITAL_COMMUNITY): Payer: Self-pay

## 2024-09-27 DIAGNOSIS — E785 Hyperlipidemia, unspecified: Secondary | ICD-10-CM

## 2024-09-27 MED ORDER — FENOFIBRATE 145 MG PO TABS
145.0000 mg | ORAL_TABLET | Freq: Every day | ORAL | 3 refills | Status: AC
  Filled 2024-09-27: qty 90, 90d supply, fill #0
  Filled 2024-12-26: qty 90, 90d supply, fill #1

## 2024-09-27 MED ORDER — FLUTICASONE PROPIONATE 50 MCG/ACT NA SUSP: 2.0000 | Freq: Every day | NASAL | 3 refills | Status: AC

## 2024-09-27 MED ORDER — ROSUVASTATIN CALCIUM 40 MG PO TABS
40.0000 mg | ORAL_TABLET | Freq: Every day | ORAL | 3 refills | Status: AC
  Filled 2024-09-27: qty 90, 90d supply, fill #0
  Filled 2024-12-26: qty 90, 90d supply, fill #1

## 2024-09-27 MED ORDER — MELOXICAM 15 MG PO TABS: 15.0000 mg | ORAL_TABLET | Freq: Every day | ORAL | 3 refills | Status: AC

## 2024-09-28 ENCOUNTER — Other Ambulatory Visit: Payer: Self-pay

## 2024-10-09 ENCOUNTER — Other Ambulatory Visit (HOSPITAL_COMMUNITY): Payer: Self-pay

## 2024-10-09 ENCOUNTER — Other Ambulatory Visit: Payer: Self-pay | Admitting: Family Medicine

## 2024-10-09 MED ORDER — ALLOPURINOL 100 MG PO TABS
200.0000 mg | ORAL_TABLET | Freq: Every day | ORAL | 3 refills | Status: AC
  Filled 2024-10-09: qty 180, 90d supply, fill #0

## 2024-10-12 ENCOUNTER — Other Ambulatory Visit (HOSPITAL_COMMUNITY): Payer: Self-pay

## 2024-10-12 ENCOUNTER — Other Ambulatory Visit: Payer: Self-pay | Admitting: Family Medicine

## 2024-10-12 ENCOUNTER — Other Ambulatory Visit: Payer: Self-pay

## 2024-10-12 NOTE — Telephone Encounter (Signed)
 Rx not on pt list of medications, please advise if ok to send Rx

## 2024-10-13 ENCOUNTER — Other Ambulatory Visit (HOSPITAL_COMMUNITY): Payer: Self-pay

## 2024-10-13 MED ORDER — OMEPRAZOLE 40 MG PO CPDR
DELAYED_RELEASE_CAPSULE | Freq: Every day | ORAL | 3 refills | Status: AC
  Filled 2024-10-13 – 2024-11-07 (×2): qty 90, 90d supply, fill #0

## 2024-10-24 ENCOUNTER — Other Ambulatory Visit (HOSPITAL_COMMUNITY): Payer: Self-pay

## 2024-11-07 ENCOUNTER — Other Ambulatory Visit (HOSPITAL_COMMUNITY): Payer: Self-pay
